# Patient Record
Sex: Male | Born: 1965 | Hispanic: Yes | Smoking: Current every day smoker
Health system: Southern US, Community
[De-identification: ages and names within clinical notes are randomized; demographics above are authoritative.]

## PROBLEM LIST (undated history)

## (undated) ENCOUNTER — Telehealth: Attending: Hematology | Primary: Hematology

## (undated) ENCOUNTER — Encounter

## (undated) ENCOUNTER — Ambulatory Visit

## (undated) ENCOUNTER — Encounter: Attending: Pharmacist | Primary: Pharmacist

## (undated) ENCOUNTER — Encounter: Attending: Hematology | Primary: Hematology

## (undated) ENCOUNTER — Encounter: Payer: MEDICAID | Attending: Pharmacist | Primary: Pharmacist

## (undated) ENCOUNTER — Telehealth: Attending: Physician Assistant | Primary: Physician Assistant

## (undated) ENCOUNTER — Ambulatory Visit: Attending: Hematology & Oncology | Primary: Hematology & Oncology

## (undated) ENCOUNTER — Telehealth

## (undated) ENCOUNTER — Ambulatory Visit: Attending: Physician Assistant | Primary: Physician Assistant

## (undated) ENCOUNTER — Telehealth: Attending: Pharmacist | Primary: Pharmacist

## (undated) ENCOUNTER — Encounter: Payer: MEDICAID | Attending: Hematology | Primary: Hematology

## (undated) ENCOUNTER — Ambulatory Visit (HOSPITAL_COMMUNITY): Admission: EM | Payer: Medicare Other

## (undated) DIAGNOSIS — C9210 Chronic myeloid leukemia, BCR/ABL-positive, not having achieved remission: Secondary | ICD-10-CM

## (undated) HISTORY — DX: Chronic myeloid leukemia, BCR/ABL-positive, not having achieved remission: C92.10

## (undated) HISTORY — PX: OTHER SURGICAL HISTORY: SHX169

---

## 2006-03-03 DIAGNOSIS — C9210 Chronic myeloid leukemia, BCR/ABL-positive, not having achieved remission: Secondary | ICD-10-CM

## 2006-03-03 HISTORY — DX: Chronic myeloid leukemia, BCR/ABL-positive, not having achieved remission: C92.10

## 2006-07-10 ENCOUNTER — Encounter (INDEPENDENT_AMBULATORY_CARE_PROVIDER_SITE_OTHER): Payer: Self-pay | Admitting: Oncology

## 2006-07-10 ENCOUNTER — Other Ambulatory Visit: Admission: RE | Admit: 2006-07-10 | Discharge: 2006-07-10 | Payer: Self-pay | Admitting: Oncology

## 2006-07-24 ENCOUNTER — Ambulatory Visit: Payer: Self-pay | Admitting: Oncology

## 2006-11-19 ENCOUNTER — Ambulatory Visit: Payer: Self-pay | Admitting: Oncology

## 2008-08-28 ENCOUNTER — Encounter (INDEPENDENT_AMBULATORY_CARE_PROVIDER_SITE_OTHER): Payer: Self-pay | Admitting: Oncology

## 2008-08-28 ENCOUNTER — Other Ambulatory Visit: Admission: RE | Admit: 2008-08-28 | Discharge: 2008-08-28 | Payer: Self-pay | Admitting: Oncology

## 2011-11-21 ENCOUNTER — Encounter: Payer: Self-pay | Admitting: Physical Medicine & Rehabilitation

## 2011-11-28 ENCOUNTER — Ambulatory Visit: Payer: Medicaid Other | Admitting: Physical Medicine & Rehabilitation

## 2011-11-28 ENCOUNTER — Encounter: Payer: Medicaid Other | Attending: Physical Medicine & Rehabilitation

## 2011-12-15 ENCOUNTER — Ambulatory Visit: Payer: Medicaid Other | Admitting: Physical Medicine & Rehabilitation

## 2011-12-18 ENCOUNTER — Ambulatory Visit: Payer: Medicaid Other | Admitting: Physical Medicine & Rehabilitation

## 2011-12-30 ENCOUNTER — Ambulatory Visit (HOSPITAL_BASED_OUTPATIENT_CLINIC_OR_DEPARTMENT_OTHER): Payer: Medicaid Other | Admitting: Physical Medicine & Rehabilitation

## 2011-12-30 ENCOUNTER — Encounter: Payer: Self-pay | Admitting: Physical Medicine & Rehabilitation

## 2011-12-30 ENCOUNTER — Encounter: Payer: Medicaid Other | Attending: Physical Medicine & Rehabilitation

## 2011-12-30 VITALS — BP 116/68 | HR 73 | Ht 68.0 in | Wt 137.0 lb

## 2011-12-30 DIAGNOSIS — M542 Cervicalgia: Secondary | ICD-10-CM | POA: Insufficient documentation

## 2011-12-30 DIAGNOSIS — Z5181 Encounter for therapeutic drug level monitoring: Secondary | ICD-10-CM

## 2011-12-30 DIAGNOSIS — M25519 Pain in unspecified shoulder: Secondary | ICD-10-CM | POA: Insufficient documentation

## 2011-12-30 DIAGNOSIS — R209 Unspecified disturbances of skin sensation: Secondary | ICD-10-CM | POA: Insufficient documentation

## 2011-12-30 DIAGNOSIS — C9210 Chronic myeloid leukemia, BCR/ABL-positive, not having achieved remission: Secondary | ICD-10-CM

## 2011-12-30 DIAGNOSIS — IMO0001 Reserved for inherently not codable concepts without codable children: Secondary | ICD-10-CM

## 2011-12-30 DIAGNOSIS — F17200 Nicotine dependence, unspecified, uncomplicated: Secondary | ICD-10-CM | POA: Insufficient documentation

## 2011-12-30 DIAGNOSIS — M47812 Spondylosis without myelopathy or radiculopathy, cervical region: Secondary | ICD-10-CM | POA: Insufficient documentation

## 2011-12-30 DIAGNOSIS — M549 Dorsalgia, unspecified: Secondary | ICD-10-CM

## 2011-12-30 MED ORDER — TRAMADOL HCL 50 MG PO TABS
50.0000 mg | ORAL_TABLET | Freq: Three times a day (TID) | ORAL | Status: DC | PRN
Start: 2011-12-30 — End: 2012-03-26

## 2011-12-30 MED ORDER — DICLOFENAC SODIUM 1 % TD GEL: 2.0000 g | Freq: Four times a day (QID) | TRANSDERMAL | Status: DC

## 2011-12-30 NOTE — Patient Instructions (Signed)
Please have a chiropractor send me notes as well as x-ray
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
## 2012-01-27 ENCOUNTER — Ambulatory Visit (HOSPITAL_BASED_OUTPATIENT_CLINIC_OR_DEPARTMENT_OTHER): Payer: Medicaid Other | Admitting: Physical Medicine & Rehabilitation

## 2012-01-27 ENCOUNTER — Encounter: Payer: Self-pay | Admitting: Physical Medicine & Rehabilitation

## 2012-01-27 ENCOUNTER — Encounter: Payer: Medicaid Other | Attending: Physical Medicine & Rehabilitation

## 2012-01-27 VITALS — BP 111/67 | HR 68 | Resp 14 | Ht 68.0 in | Wt 142.6 lb

## 2012-01-27 DIAGNOSIS — M47812 Spondylosis without myelopathy or radiculopathy, cervical region: Secondary | ICD-10-CM | POA: Insufficient documentation

## 2012-01-27 DIAGNOSIS — R209 Unspecified disturbances of skin sensation: Secondary | ICD-10-CM | POA: Insufficient documentation

## 2012-01-27 DIAGNOSIS — IMO0001 Reserved for inherently not codable concepts without codable children: Secondary | ICD-10-CM | POA: Insufficient documentation

## 2012-01-27 DIAGNOSIS — F17200 Nicotine dependence, unspecified, uncomplicated: Secondary | ICD-10-CM | POA: Insufficient documentation

## 2012-01-27 DIAGNOSIS — M25519 Pain in unspecified shoulder: Secondary | ICD-10-CM

## 2012-01-27 DIAGNOSIS — M542 Cervicalgia: Secondary | ICD-10-CM | POA: Insufficient documentation

## 2012-01-27 NOTE — Patient Instructions (Signed)
You had a right a.c. Joint injection today. We injected lidocaine and a cortisone medicine. It may take up to 3 days to take full effect. I'll see back next month

## 2012-01-27 NOTE — Progress Notes (Signed)
Right shoulder injection a.c. Joint under ultrasound guidance Indication a.c. Joint pain not relieved by other medications and conservative care Informed consent was obtained after describing risks and benefits of the procedure with the patient his include bleeding bruising and infection he elects to proceed and has given written consent Patient placed in a seated position a.c. Joint was pre-scanned area was marked and prepped with Betadine and entered with a 25-gauge injection needle 1 cc 1% lidocaine was injected into the skin and subcutaneous tissues followed by insertion of 40 mm pedicle block needle into the right SI joint under direct ultrasound guidance. Then 1/2 cc of 40 mg Depo-Medrol and 1 cc of 1% lidocaine were injected. Patient tolerated procedure well. Post procedure instructions given

## 2012-02-20 ENCOUNTER — Telehealth: Payer: Self-pay | Admitting: *Deleted

## 2012-02-20 NOTE — Telephone Encounter (Signed)
Patients doesn't feel his medication is working.  He is going to try tylenol and/or ibuprofen intermittently, heat and ice until his next appointment.

## 2012-02-20 NOTE — Telephone Encounter (Signed)
Please call about pain medication. Not helping

## 2012-02-27 ENCOUNTER — Encounter: Payer: Medicaid Other | Attending: Physical Medicine & Rehabilitation

## 2012-02-27 ENCOUNTER — Ambulatory Visit: Payer: Medicaid Other | Admitting: Physical Medicine & Rehabilitation

## 2012-02-27 DIAGNOSIS — IMO0001 Reserved for inherently not codable concepts without codable children: Secondary | ICD-10-CM | POA: Insufficient documentation

## 2012-02-27 DIAGNOSIS — M47812 Spondylosis without myelopathy or radiculopathy, cervical region: Secondary | ICD-10-CM | POA: Insufficient documentation

## 2012-02-27 DIAGNOSIS — M25519 Pain in unspecified shoulder: Secondary | ICD-10-CM | POA: Insufficient documentation

## 2012-02-27 DIAGNOSIS — F17200 Nicotine dependence, unspecified, uncomplicated: Secondary | ICD-10-CM | POA: Insufficient documentation

## 2012-02-27 DIAGNOSIS — M542 Cervicalgia: Secondary | ICD-10-CM | POA: Insufficient documentation

## 2012-02-27 DIAGNOSIS — R209 Unspecified disturbances of skin sensation: Secondary | ICD-10-CM | POA: Insufficient documentation

## 2012-03-26 ENCOUNTER — Encounter: Payer: Medicaid Other | Attending: Physical Medicine & Rehabilitation

## 2012-03-26 ENCOUNTER — Encounter: Payer: Self-pay | Admitting: Physical Medicine & Rehabilitation

## 2012-03-26 ENCOUNTER — Ambulatory Visit (HOSPITAL_BASED_OUTPATIENT_CLINIC_OR_DEPARTMENT_OTHER): Payer: Medicaid Other | Admitting: Physical Medicine & Rehabilitation

## 2012-03-26 VITALS — BP 123/78 | HR 93 | Resp 14 | Ht 68.0 in | Wt 148.0 lb

## 2012-03-26 DIAGNOSIS — F17200 Nicotine dependence, unspecified, uncomplicated: Secondary | ICD-10-CM | POA: Insufficient documentation

## 2012-03-26 DIAGNOSIS — M47812 Spondylosis without myelopathy or radiculopathy, cervical region: Secondary | ICD-10-CM | POA: Insufficient documentation

## 2012-03-26 DIAGNOSIS — R209 Unspecified disturbances of skin sensation: Secondary | ICD-10-CM | POA: Insufficient documentation

## 2012-03-26 DIAGNOSIS — IMO0001 Reserved for inherently not codable concepts without codable children: Secondary | ICD-10-CM | POA: Insufficient documentation

## 2012-03-26 DIAGNOSIS — M542 Cervicalgia: Secondary | ICD-10-CM | POA: Insufficient documentation

## 2012-03-26 DIAGNOSIS — G5610 Other lesions of median nerve, unspecified upper limb: Secondary | ICD-10-CM

## 2012-03-26 DIAGNOSIS — M25519 Pain in unspecified shoulder: Secondary | ICD-10-CM | POA: Insufficient documentation

## 2012-03-26 MED ORDER — TRAMADOL HCL 50 MG PO TABS: 50.0000 mg | ORAL_TABLET | Freq: Three times a day (TID) | ORAL | Status: DC | PRN

## 2012-03-26 MED ORDER — METHYLPREDNISOLONE 4 MG PO KIT: PACK | ORAL | Status: DC

## 2012-03-26 NOTE — Patient Instructions (Signed)
Please get a wrist splint from a pharmacy  that you can wear at night on the right side for your carpal tunnel syndrome You'll take a strong anti-inflammatory tori called Medrol Dosepak. Do not use any Advil or ibuprofen or naproxen when you taking this medicine

## 2012-04-22 ENCOUNTER — Ambulatory Visit (HOSPITAL_BASED_OUTPATIENT_CLINIC_OR_DEPARTMENT_OTHER): Payer: Medicaid Other | Admitting: Physical Medicine & Rehabilitation

## 2012-04-22 ENCOUNTER — Encounter: Payer: Medicaid Other | Attending: Physical Medicine & Rehabilitation

## 2012-04-22 ENCOUNTER — Encounter: Payer: Self-pay | Admitting: Physical Medicine & Rehabilitation

## 2012-04-22 VITALS — BP 143/84 | HR 118 | Resp 14 | Ht 68.0 in | Wt 145.0 lb

## 2012-04-22 DIAGNOSIS — G5610 Other lesions of median nerve, unspecified upper limb: Secondary | ICD-10-CM

## 2012-04-22 DIAGNOSIS — IMO0001 Reserved for inherently not codable concepts without codable children: Secondary | ICD-10-CM | POA: Insufficient documentation

## 2012-04-22 DIAGNOSIS — M542 Cervicalgia: Secondary | ICD-10-CM | POA: Insufficient documentation

## 2012-04-22 DIAGNOSIS — M25519 Pain in unspecified shoulder: Secondary | ICD-10-CM | POA: Insufficient documentation

## 2012-04-22 DIAGNOSIS — M47812 Spondylosis without myelopathy or radiculopathy, cervical region: Secondary | ICD-10-CM | POA: Insufficient documentation

## 2012-04-22 DIAGNOSIS — F17200 Nicotine dependence, unspecified, uncomplicated: Secondary | ICD-10-CM | POA: Insufficient documentation

## 2012-04-22 DIAGNOSIS — R209 Unspecified disturbances of skin sensation: Secondary | ICD-10-CM | POA: Insufficient documentation

## 2012-04-22 NOTE — Patient Instructions (Signed)
You may have numbness in your fingers today.

## 2012-04-22 NOTE — Progress Notes (Signed)
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 Symptoms are not relieved by conservative care.  Informed consent was obtained after describing risks and benefits of the procedure with the patient. These include bleeding bruising and infection as well as nerve injury. Patient elected to proceed and has given written consent. The distal wrist crease was marked and prepped with Betadine. A 30-gauge 1/2 inch needle was inserted and 0.25 ML's of 1% lidocaine injected into the skin and subcutaneous tissue. Then a 25-gauge 11/2 inch needle was inserted into the carpal tunnel and 0.25 mL of depomedrol 40 mg per mL was injected. Patient tolerated procedure well. Post procedure instructions given.

## 2012-04-23 ENCOUNTER — Telehealth: Payer: Self-pay

## 2012-04-23 NOTE — Telephone Encounter (Signed)
Patient called to find out if he was getting any other pain medication.  He would like something stronger.  Please advise.

## 2012-04-23 NOTE — Telephone Encounter (Signed)
No he was still getting hydrocodone from PCP

## 2012-04-26 NOTE — Telephone Encounter (Signed)
Tried both numbers for patient.  Neither available.

## 2012-05-18 ENCOUNTER — Inpatient Hospital Stay: Payer: Medicaid Other | Admitting: Physical Medicine & Rehabilitation

## 2012-05-18 DIAGNOSIS — F17200 Nicotine dependence, unspecified, uncomplicated: Secondary | ICD-10-CM | POA: Insufficient documentation

## 2012-05-18 DIAGNOSIS — M542 Cervicalgia: Secondary | ICD-10-CM | POA: Insufficient documentation

## 2012-05-18 DIAGNOSIS — M47812 Spondylosis without myelopathy or radiculopathy, cervical region: Secondary | ICD-10-CM | POA: Insufficient documentation

## 2012-05-18 DIAGNOSIS — M25519 Pain in unspecified shoulder: Secondary | ICD-10-CM | POA: Insufficient documentation

## 2012-05-18 DIAGNOSIS — IMO0001 Reserved for inherently not codable concepts without codable children: Secondary | ICD-10-CM | POA: Insufficient documentation

## 2012-05-18 DIAGNOSIS — R209 Unspecified disturbances of skin sensation: Secondary | ICD-10-CM | POA: Insufficient documentation

## 2012-05-27 ENCOUNTER — Ambulatory Visit (HOSPITAL_BASED_OUTPATIENT_CLINIC_OR_DEPARTMENT_OTHER): Payer: Medicaid Other | Admitting: Physical Medicine & Rehabilitation

## 2012-05-27 ENCOUNTER — Encounter: Payer: Medicaid Other | Attending: Physical Medicine & Rehabilitation

## 2012-05-27 ENCOUNTER — Encounter: Payer: Self-pay | Admitting: Physical Medicine & Rehabilitation

## 2012-05-27 VITALS — BP 128/73 | HR 76 | Resp 14 | Ht 69.0 in | Wt 142.0 lb

## 2012-05-27 DIAGNOSIS — M47812 Spondylosis without myelopathy or radiculopathy, cervical region: Secondary | ICD-10-CM

## 2012-05-27 DIAGNOSIS — Z5181 Encounter for therapeutic drug level monitoring: Secondary | ICD-10-CM

## 2012-05-27 DIAGNOSIS — IMO0001 Reserved for inherently not codable concepts without codable children: Secondary | ICD-10-CM

## 2012-05-27 DIAGNOSIS — Z79899 Other long term (current) drug therapy: Secondary | ICD-10-CM

## 2012-05-27 DIAGNOSIS — M545 Low back pain, unspecified: Secondary | ICD-10-CM

## 2012-05-27 MED ORDER — TRAMADOL HCL 50 MG PO TABS: 100.0000 mg | ORAL_TABLET | Freq: Three times a day (TID) | ORAL | Status: DC | PRN

## 2012-05-27 MED ORDER — CYCLOBENZAPRINE HCL 5 MG PO TABS: 5.0000 mg | ORAL_TABLET | Freq: Three times a day (TID) | ORAL | Status: DC | PRN

## 2012-05-27 NOTE — Patient Instructions (Signed)
Double your tramadol dose Flexeril up to 3 times per day to reduce back spasms Physical therapy for neck and back exercises X-ray lumbar spine Urine drug screen today See me in 3 weeks  recheck

## 2012-05-27 NOTE — Progress Notes (Signed)
Subjective:    Patient ID: Randall [name], male    DOB: 01-18-1966, 47 y.o.   MRN: [id number]  HPI Complaint of low back pain which has started approximately 2-3 weeks ago. No history of,. No bowel or bladder dysfunction. No fever chills. No lower extremity weakness. Continues to have chronic neck pain. Tramadol 50 mg 3 times a day plus Voltaren gel to neck is not helping. Laying on a hard surface with his legs up seems to help. Twisting and popping his back seems to help as well. Pain Inventory Average Pain 8 Pain Right Now 8 My pain is sharp, burning, dull, stabbing, tingling and aching  In the last 24 hours, has pain interfered with the following? General activity 3 Relation with others 3 Enjoyment of life 4 What TIME of day is your pain at its worst? all the time Sleep (in general) Poor  Pain is worse with: bending, sitting and standing Pain improves with: medication Relief from Meds: 3  Mobility how many minutes can you walk? 20 ability to climb steps?  yes do you drive?  no needs help with transfers Do you have any goals in this area?  yes  Function disabled: date disabled 26 I need assistance with the following:  feeding, meal prep, household duties and shopping  Neuro/Psych bladder control problems bowel control problems weakness numbness tingling spasms confusion depression anxiety loss of taste or smell  Prior Studies Any changes since last visit?  no  Physicians involved in your care Any changes since last visit?  no   History reviewed. No pertinent family history. History   Social History  . Marital Status: Married    Spouse Name: N/A    Number of Children: N/A  . Years of Education: N/A   Social History Main Topics  . Smoking status: Current Every Day Smoker    Types: Cigarettes    Start date: 06/30/2011  . Smokeless tobacco: Never Used  . Alcohol Use: None  . Drug Use: None  . Sexually Active: None   Other Topics Concern  .  None   Social History Narrative  . None   Past Surgical History  Procedure Laterality Date  . Splenectomy  2010-2011   Past Medical History  Diagnosis Date  . CML (chronic myelocytic leukemia) 2008   BP 128/73  Pulse 76  Resp 14  Ht 5\' 9"  (1.753 m)  Wt 142 lb (64.411 kg)  BMI 20.96 kg/m2  SpO2 100%     Review of Systems  Constitutional: Positive for fever and unexpected weight change.  Respiratory: Positive for apnea.   Gastrointestinal: Positive for diarrhea and constipation.  Genitourinary: Positive for difficulty urinating.  Musculoskeletal: Positive for back pain.  Neurological: Positive for weakness and numbness.  Psychiatric/Behavioral: Positive for confusion and dysphoric mood. The patient is nervous/anxious.   All other systems reviewed and are negative.       Objective:   Physical Exam  Nursing note and vitals reviewed. Constitutional: He is oriented to person, place, and time. He appears well-developed and well-nourished.  HENT:  Head: Normocephalic and atraumatic.  Eyes: Conjunctivae and EOM are normal. Pupils are equal, round, and reactive to light.  Musculoskeletal:  Reduced in flexion extension lumbar spine twisting is normal and helps pain Able to touch patella when bending toward Negative straight leg raise  Neurological: He is alert and oriented to person, place, and time. He has normal strength. He displays no atrophy. He exhibits normal muscle tone. Coordination and gait normal.  Reflex Scores:      Patellar reflexes are 1+ on the right side and 2+ on the left side.      Achilles reflexes are 2+ on the right side and 2+ on the left side. Decreased left L678 dermatomal pinprick  Psychiatric: He has a normal mood and affect.   decrease left L4, decreased right L5 and S1 dermatome pinprick  Mild tenderness to palpation lumbar paraspinal muscles. Mild tenderness palpation upper trapezius muscle group      Assessment & Plan:  1. 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 Overall stable no evidence of progressive radiculopathy.Will order a transcutaneous electrical nerve stimulator trial. Instruction with physical therapy on cervicothoracic stabilization exercise 2. Lumbar pain: Referral to physical therapy instruction on lumbosacral stabilization program Increase tramadol to 100 mg 3 times a day Flexeril 5 mg 3 times a day for the next 7-10 days Lumbosacral spine x-rays history CML although I do not think this is a cancer-related problem  Return to clinic 3 weeks. Urine drug screen today. Consider low-dose narcotic analgesic next visit if not much better

## 2012-06-03 NOTE — Progress Notes (Signed)
Since he was previously non narcotic we can offer him that Otherwise we can dismiss and give list

## 2012-06-21 ENCOUNTER — Ambulatory Visit (HOSPITAL_BASED_OUTPATIENT_CLINIC_OR_DEPARTMENT_OTHER): Payer: Medicaid Other | Admitting: Physical Medicine & Rehabilitation

## 2012-06-21 ENCOUNTER — Encounter: Payer: Self-pay | Admitting: Physical Medicine & Rehabilitation

## 2012-06-21 ENCOUNTER — Encounter: Payer: Medicaid Other | Attending: Physical Medicine & Rehabilitation

## 2012-06-21 VITALS — BP 136/85 | HR 88 | Resp 15 | Ht 68.0 in | Wt 134.0 lb

## 2012-06-21 DIAGNOSIS — Z856 Personal history of leukemia: Secondary | ICD-10-CM | POA: Insufficient documentation

## 2012-06-21 DIAGNOSIS — M5030 Other cervical disc degeneration, unspecified cervical region: Secondary | ICD-10-CM | POA: Insufficient documentation

## 2012-06-21 DIAGNOSIS — IMO0001 Reserved for inherently not codable concepts without codable children: Secondary | ICD-10-CM | POA: Insufficient documentation

## 2012-06-21 DIAGNOSIS — M47812 Spondylosis without myelopathy or radiculopathy, cervical region: Secondary | ICD-10-CM | POA: Insufficient documentation

## 2012-06-21 DIAGNOSIS — M545 Low back pain, unspecified: Secondary | ICD-10-CM | POA: Insufficient documentation

## 2012-06-21 MED ORDER — PREGABALIN 75 MG PO CAPS: 75.0000 mg | ORAL_CAPSULE | Freq: Two times a day (BID) | ORAL | Status: DC

## 2012-06-21 MED ORDER — DICLOFENAC SODIUM 1 % TD GEL: 2.0000 g | Freq: Four times a day (QID) | TRANSDERMAL | Status: DC

## 2012-06-21 MED ORDER — CYCLOBENZAPRINE HCL 10 MG PO TABS: 10.0000 mg | ORAL_TABLET | Freq: Three times a day (TID) | ORAL | Status: DC | PRN

## 2012-06-21 MED ORDER — CYCLOBENZAPRINE HCL 5 MG PO TABS: 10.0000 mg | ORAL_TABLET | Freq: Three times a day (TID) | ORAL | Status: DC | PRN

## 2012-06-21 NOTE — Progress Notes (Signed)
Subjective:    Patient ID: Randall [name], male    DOB: 02/24/1966, 47 y.o.   MRN: [id number]  HPI Neck and back pain. No falls no injuries. UDS positive for THC. Has not followed through on physical therapy Has not followed through on x-rays of lumbar spine.  Past history CML in remission Pain Inventory Average Pain 8 Pain Right Now n/a My pain is sharp, burning, stabbing, tingling and aching  In the last 24 hours, has pain interfered with the following? General activity 6 Relation with others 6 Enjoyment of life 6 What TIME of day is your pain at its worst? morning,evening,night time Sleep (in general) Poor  Pain is worse with: sitting and some activites Pain improves with: medication Relief from Meds: 2  Mobility how many minutes can you walk? 15-20 min. ability to climb steps?  yes do you drive?  no transfers alone  Function not employed: date last employed n/a I need assistance with the following:  household duties Do you have any goals in this area?  no  Neuro/Psych weakness numbness tingling spasms dizziness depression anxiety  Prior Studies Any changes since last visit?  no x-rays CT/MRI nerve study  Physicians involved in your care Any changes since last visit?  no   History reviewed. No pertinent family history. History   Social History  . Marital Status: Married    Spouse Name: N/A    Number of Children: N/A  . Years of Education: N/A   Social History Main Topics  . Smoking status: Current Every Day Smoker    Types: Cigarettes    Start date: 06/30/2011  . Smokeless tobacco: Never Used  . Alcohol Use: None  . Drug Use: None  . Sexually Active: None   Other Topics Concern  . None   Social History Narrative  . None   Past Surgical History  Procedure Laterality Date  . Splenectomy  2010-2011   Past Medical History  Diagnosis Date  . CML (chronic myelocytic leukemia) 2008   BP 136/85  Pulse 88  Resp 15  Ht 5\' 8"  (1.727  m)  Wt 134 lb (60.782 kg)  BMI 20.38 kg/m2  SpO2 95%      Review of Systems  Constitutional: Positive for diaphoresis, appetite change and unexpected weight change.  Respiratory: Positive for shortness of breath.   Gastrointestinal: Positive for abdominal pain and constipation.  Neurological: Positive for dizziness, weakness and numbness.  Psychiatric/Behavioral: Positive for dysphoric mood and agitation.  All other systems reviewed and are negative.       Objective:   Physical Exam  Nursing note and vitals reviewed. Constitutional: He is oriented to person, place, and time. He appears well-developed and well-nourished.  HENT:  Head: Normocephalic and atraumatic.  Eyes: Conjunctivae and EOM are normal. Pupils are equal, round, and reactive to light.  Neck: Normal range of motion. Neck supple.  Musculoskeletal: Normal range of motion.       Cervical back: He exhibits tenderness. He exhibits normal range of motion and no deformity.       Thoracic back: He exhibits tenderness. He exhibits normal range of motion and no deformity.       Lumbar back: He exhibits tenderness. He exhibits normal range of motion and no pain.  9/18 fibromyalgia pts. positive  Neurological: He is alert and oriented to person, place, and time. He has normal reflexes. He displays normal reflexes. He exhibits normal muscle tone. Coordination normal.  Psychiatric: He has a normal mood and  affect.          Assessment & Plan:  1. Cervical degenerative disc as well as cervical spondylosis. Overall stable no evidence of progressive radiculopathy.Will order a transcutaneous electrical nerve stimulator trial. Instruction with physical therapy on cervicothoracic stabilization exercise 2. Lumbar pain: Referral to physical therapy instruction on lumbosacral stabilization program 3.  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

## 2012-08-02 ENCOUNTER — Encounter: Payer: Medicaid Other | Attending: Physical Medicine & Rehabilitation

## 2012-08-02 ENCOUNTER — Ambulatory Visit: Payer: Medicaid Other | Admitting: Physical Medicine & Rehabilitation

## 2012-08-02 DIAGNOSIS — M47812 Spondylosis without myelopathy or radiculopathy, cervical region: Secondary | ICD-10-CM | POA: Insufficient documentation

## 2012-08-02 DIAGNOSIS — R209 Unspecified disturbances of skin sensation: Secondary | ICD-10-CM | POA: Insufficient documentation

## 2012-08-02 DIAGNOSIS — F17200 Nicotine dependence, unspecified, uncomplicated: Secondary | ICD-10-CM | POA: Insufficient documentation

## 2012-08-02 DIAGNOSIS — IMO0001 Reserved for inherently not codable concepts without codable children: Secondary | ICD-10-CM | POA: Insufficient documentation

## 2012-08-02 DIAGNOSIS — M542 Cervicalgia: Secondary | ICD-10-CM | POA: Insufficient documentation

## 2012-08-02 DIAGNOSIS — M25519 Pain in unspecified shoulder: Secondary | ICD-10-CM | POA: Insufficient documentation

## 2012-08-12 ENCOUNTER — Ambulatory Visit: Payer: Medicaid Other | Admitting: Physical Medicine & Rehabilitation

## 2016-01-08 DIAGNOSIS — C9210 Chronic myeloid leukemia, BCR/ABL-positive, not having achieved remission: Secondary | ICD-10-CM

## 2016-01-08 DIAGNOSIS — B1920 Unspecified viral hepatitis C without hepatic coma: Secondary | ICD-10-CM

## 2016-10-24 DIAGNOSIS — B1920 Unspecified viral hepatitis C without hepatic coma: Secondary | ICD-10-CM

## 2016-10-24 DIAGNOSIS — R5383 Other fatigue: Secondary | ICD-10-CM

## 2016-10-24 DIAGNOSIS — C9210 Chronic myeloid leukemia, BCR/ABL-positive, not having achieved remission: Secondary | ICD-10-CM

## 2016-12-26 DIAGNOSIS — C9210 Chronic myeloid leukemia, BCR/ABL-positive, not having achieved remission: Secondary | ICD-10-CM

## 2016-12-26 DIAGNOSIS — F329 Major depressive disorder, single episode, unspecified: Secondary | ICD-10-CM

## 2017-02-26 DIAGNOSIS — C9210 Chronic myeloid leukemia, BCR/ABL-positive, not having achieved remission: Secondary | ICD-10-CM | POA: Diagnosis not present

## 2017-02-26 DIAGNOSIS — R110 Nausea: Secondary | ICD-10-CM | POA: Diagnosis not present

## 2017-04-09 DIAGNOSIS — C9210 Chronic myeloid leukemia, BCR/ABL-positive, not having achieved remission: Secondary | ICD-10-CM | POA: Diagnosis not present

## 2017-07-23 DIAGNOSIS — C9210 Chronic myeloid leukemia, BCR/ABL-positive, not having achieved remission: Secondary | ICD-10-CM

## 2017-11-26 DIAGNOSIS — Z79899 Other long term (current) drug therapy: Secondary | ICD-10-CM

## 2017-11-26 DIAGNOSIS — C9210 Chronic myeloid leukemia, BCR/ABL-positive, not having achieved remission: Secondary | ICD-10-CM

## 2018-07-26 ENCOUNTER — Other Ambulatory Visit: Payer: Self-pay

## 2018-07-26 ENCOUNTER — Encounter (HOSPITAL_COMMUNITY): Payer: Self-pay

## 2018-07-26 ENCOUNTER — Emergency Department (HOSPITAL_COMMUNITY)
Admission: EM | Admit: 2018-07-26 | Discharge: 2018-07-26 | Disposition: A | Discharge status: Home / Self Care | Payer: Medicaid Other | Attending: Emergency Medicine | Admitting: Emergency Medicine

## 2018-07-26 ENCOUNTER — Emergency Department (HOSPITAL_COMMUNITY)
Admission: EM | Admit: 2018-07-26 | Discharge: 2018-07-27 | Disposition: A | Discharge status: Home / Self Care | Payer: Medicaid Other | Source: Home / Self Care | Attending: Emergency Medicine | Admitting: Emergency Medicine

## 2018-07-26 DIAGNOSIS — R45850 Homicidal ideations: Secondary | ICD-10-CM | POA: Insufficient documentation

## 2018-07-26 DIAGNOSIS — F329 Major depressive disorder, single episode, unspecified: Secondary | ICD-10-CM | POA: Insufficient documentation

## 2018-07-26 DIAGNOSIS — F15950 Other stimulant use, unspecified with stimulant-induced psychotic disorder with delusions: Secondary | ICD-10-CM

## 2018-07-26 DIAGNOSIS — Z856 Personal history of leukemia: Secondary | ICD-10-CM | POA: Insufficient documentation

## 2018-07-26 DIAGNOSIS — Z590 Homelessness: Secondary | ICD-10-CM | POA: Insufficient documentation

## 2018-07-26 DIAGNOSIS — Z79899 Other long term (current) drug therapy: Secondary | ICD-10-CM | POA: Insufficient documentation

## 2018-07-26 DIAGNOSIS — R45851 Suicidal ideations: Secondary | ICD-10-CM | POA: Diagnosis not present

## 2018-07-26 DIAGNOSIS — F1910 Other psychoactive substance abuse, uncomplicated: Secondary | ICD-10-CM

## 2018-07-26 DIAGNOSIS — F29 Unspecified psychosis not due to a substance or known physiological condition: Secondary | ICD-10-CM

## 2018-07-26 DIAGNOSIS — F1510 Other stimulant abuse, uncomplicated: Secondary | ICD-10-CM | POA: Diagnosis present

## 2018-07-26 DIAGNOSIS — F17210 Nicotine dependence, cigarettes, uncomplicated: Secondary | ICD-10-CM | POA: Insufficient documentation

## 2018-07-26 DIAGNOSIS — F15959 Other stimulant use, unspecified with stimulant-induced psychotic disorder, unspecified: Secondary | ICD-10-CM | POA: Diagnosis present

## 2018-07-26 LAB — SALICYLATE LEVEL: Salicylate Lvl: <7 mg/dL (ref 2.8–30.0)

## 2018-07-26 LAB — COMPREHENSIVE METABOLIC PANEL
ALT: 46 U/L — ABNORMAL HIGH (ref 0–44)
ALT: 38 U/L (ref 0–44)
AST: 54 U/L — ABNORMAL HIGH (ref 15–41)
AST: 40 U/L (ref 15–41)
Albumin: 4.3 g/dL (ref 3.5–5.0)
Albumin: 3.8 g/dL (ref 3.5–5.0)
Alkaline Phosphatase: 97 U/L (ref 38–126)
Alkaline Phosphatase: 66 U/L (ref 38–126)
Anion gap: 12 (ref 5–15)
Anion gap: 8 (ref 5–15)
BUN: 35 mg/dL — ABNORMAL HIGH (ref 6–20)
BUN: 22 mg/dL — ABNORMAL HIGH (ref 6–20)
CO2: 26 mmol/L (ref 22–32)
CO2: 28 mmol/L (ref 22–32)
Calcium: 9.1 mg/dL (ref 8.9–10.3)
Calcium: 9 mg/dL (ref 8.9–10.3)
Chloride: 98 mmol/L (ref 98–111)
Chloride: 101 mmol/L (ref 98–111)
Creatinine, Ser: 1.55 mg/dL — ABNORMAL HIGH (ref 0.61–1.24)
Creatinine, Ser: 0.95 mg/dL (ref 0.61–1.24)
GFR calc Af Amer: 59 mL/min — ABNORMAL LOW (ref >60)
GFR calc Af Amer: >60 mL/min (ref >60)
GFR calc non Af Amer: 51 mL/min — ABNORMAL LOW (ref >60)
GFR calc non Af Amer: >60 mL/min (ref >60)
Glucose, Bld: 125 mg/dL — ABNORMAL HIGH (ref 70–99)
Glucose, Bld: 128 mg/dL — ABNORMAL HIGH (ref 70–99)
Potassium: 3.5 mmol/L (ref 3.5–5.1)
Potassium: 3.8 mmol/L (ref 3.5–5.1)
Sodium: 136 mmol/L (ref 135–145)
Sodium: 137 mmol/L (ref 135–145)
Total Bilirubin: 0.7 mg/dL (ref 0.3–1.2)
Total Bilirubin: 0.9 mg/dL (ref 0.3–1.2)
Total Protein: 7.7 g/dL (ref 6.5–8.1)
Total Protein: 6.9 g/dL (ref 6.5–8.1)

## 2018-07-26 LAB — CBC WITH DIFFERENTIAL/PLATELET
Abs Immature Granulocytes: 0.04 10*3/uL (ref 0.00–0.07)
Basophils Absolute: 0.1 10*3/uL (ref 0.0–0.1)
Basophils Relative: 1 %
Eosinophils Absolute: 0.3 10*3/uL (ref 0.0–0.5)
Eosinophils Relative: 2 %
HCT: 41.6 % (ref 39.0–52.0)
Hemoglobin: 14.5 g/dL (ref 13.0–17.0)
Immature Granulocytes: 0 %
Lymphocytes Relative: 26 %
Lymphs Abs: 4.7 10*3/uL — ABNORMAL HIGH (ref 0.7–4.0)
MCH: 32.1 pg (ref 26.0–34.0)
MCHC: 34.9 g/dL (ref 30.0–36.0)
MCV: 92 fL (ref 80.0–100.0)
Monocytes Absolute: 2 10*3/uL — ABNORMAL HIGH (ref 0.1–1.0)
Monocytes Relative: 11 %
Neutro Abs: 10.7 10*3/uL — ABNORMAL HIGH (ref 1.7–7.7)
Neutrophils Relative %: 60 %
Platelets: 397 10*3/uL (ref 150–400)
RBC: 4.52 MIL/uL (ref 4.22–5.81)
RDW: 15 % (ref 11.5–15.5)
WBC: 17.8 10*3/uL — ABNORMAL HIGH (ref 4.0–10.5)
nRBC: 0 % (ref 0.0–0.2)

## 2018-07-26 LAB — RAPID URINE DRUG SCREEN, HOSP PERFORMED
Amphetamines: POSITIVE — AB
Barbiturates: NOT DETECTED
Benzodiazepines: NOT DETECTED
Cocaine: NOT DETECTED
Opiates: NOT DETECTED
Tetrahydrocannabinol: POSITIVE — AB

## 2018-07-26 LAB — CBC
HCT: 43.5 % (ref 39.0–52.0)
Hemoglobin: 14.8 g/dL (ref 13.0–17.0)
MCH: 31 pg (ref 26.0–34.0)
MCHC: 34 g/dL (ref 30.0–36.0)
MCV: 91.2 fL (ref 80.0–100.0)
Platelets: 401 10*3/uL — ABNORMAL HIGH (ref 150–400)
RBC: 4.77 MIL/uL (ref 4.22–5.81)
RDW: 14.7 % (ref 11.5–15.5)
WBC: 13.1 10*3/uL — ABNORMAL HIGH (ref 4.0–10.5)
nRBC: 0 % (ref 0.0–0.2)

## 2018-07-26 LAB — ETHANOL
Alcohol, Ethyl (B): <10 mg/dL (ref <10)
Alcohol, Ethyl (B): <10 mg/dL (ref <10)

## 2018-07-26 LAB — ACETAMINOPHEN LEVEL: Acetaminophen (Tylenol), Serum: <10 ug/mL — ABNORMAL LOW (ref 10–30)

## 2018-07-26 MED ORDER — ONDANSETRON HCL 4 MG PO TABS: 4.0000 mg | ORAL_TABLET | Freq: Three times a day (TID) | ORAL | Status: DC | PRN

## 2018-07-26 MED ORDER — NICOTINE 21 MG/24HR TD PT24: 21.0000 mg | MEDICATED_PATCH | Freq: Every day | TRANSDERMAL | Status: DC

## 2018-07-26 MED ORDER — OLANZAPINE 5 MG PO TABS
5.0000 mg | ORAL_TABLET | Freq: Two times a day (BID) | ORAL | Status: DC
  Administered 2018-07-26: 10:00:00 5 mg via ORAL
  Filled 2018-07-26: qty 1

## 2018-07-26 MED ORDER — ALUM & MAG HYDROXIDE-SIMETH 200-200-20 MG/5ML PO SUSP: 30.0000 mL | Freq: Four times a day (QID) | ORAL | Status: DC | PRN

## 2018-07-26 MED ORDER — LORAZEPAM 2 MG/ML IJ SOLN: 0.0000 mg | Freq: Two times a day (BID) | INTRAMUSCULAR | Status: DC

## 2018-07-26 MED ORDER — LORAZEPAM 1 MG PO TABS: 0.0000 mg | ORAL_TABLET | Freq: Two times a day (BID) | ORAL | Status: DC

## 2018-07-26 MED ORDER — LORAZEPAM 1 MG PO TABS: 0.0000 mg | ORAL_TABLET | Freq: Four times a day (QID) | ORAL | Status: DC

## 2018-07-26 MED ORDER — LORAZEPAM 2 MG/ML IJ SOLN: 0.0000 mg | Freq: Four times a day (QID) | INTRAMUSCULAR | Status: DC

## 2018-07-26 MED ORDER — VITAMIN B-1 100 MG PO TABS
100.0000 mg | ORAL_TABLET | Freq: Every day | ORAL | Status: DC
  Administered 2018-07-26: 100 mg via ORAL
  Filled 2018-07-26: qty 1

## 2018-07-26 MED ORDER — OLANZAPINE 5 MG PO TABS: 2.5000 mg | ORAL_TABLET | Freq: Two times a day (BID) | ORAL | 0 refills | Status: DC

## 2018-07-26 MED ORDER — THIAMINE HCL 100 MG/ML IJ SOLN: 100.0000 mg | Freq: Every day | INTRAMUSCULAR | Status: DC

## 2018-07-26 MED ORDER — OLANZAPINE 5 MG PO TABS: 2.5000 mg | ORAL_TABLET | Freq: Two times a day (BID) | ORAL | 1 refills | Status: DC

## 2018-07-26 MED ORDER — ACETAMINOPHEN 325 MG PO TABS: 650.0000 mg | ORAL_TABLET | ORAL | Status: DC | PRN

## 2018-07-26 NOTE — ED Notes (Signed)
Pt refusing to be wanded. He states that we are trying to put COVID 19 in him.

## 2018-07-26 NOTE — Consult Note (Addendum)
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 male patient who denies suicidal/homicidal ideations, hallucinations, and withdrawal symptoms  HPI:  53 yo male who presented to the ED after using drugs and feeling like people were trying to kill him.  On assessment, he stated, "I am upset because you woke me up."  Denies suicidal/homicidal ideations, hallucinations, or withdrawal symptoms.  Irritable and wants to sleep.  Patient has a long history of substance abuse. Use increases with money and decreases with limited to no funds.  Requested the RN let him eat lunch and sleep a few hours prior to discharge, stable psychiatrically.  Past Psychiatric History: substance abuse  Risk to Self:  none Risk to Others:  none Prior Inpatient Therapy:  [place] Prior Outpatient Therapy:  yes  Past Medical History:  Past Medical History:  Diagnosis Date  . 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 No pertinent family history. Family Psychiatric  History: none Social History:  Social History   Substance and Sexual Activity  Alcohol Use Not on file     Social History   Substance and Sexual Activity  Drug Use Not on file    Social History   Socioeconomic History  . Marital status: Divorced    Spouse name: Not on file  . Number of children: Not on file  . Years of education: Not on file  . Highest education level: Not on file  Occupational History  . Not on file  Social  Needs  . Financial resource strain: Not on file  . Food insecurity:    Worry: Not on file    Inability: Not on file  . Transportation needs:    Medical: Not on file    Non-medical: Not on file  Tobacco Use  . Smoking status: Current Every Day Smoker    Types: Cigarettes    Start date: 06/30/2011  . Smokeless tobacco: Never Used  Substance and Sexual Activity  . Alcohol use: Not on file  . Drug use: Not on file  . Sexual activity: Not on file  Lifestyle  . Physical activity:    Days per week: Not on file    Minutes per session: Not on file  . Stress: Not on file  Relationships  . Social connections:    Talks on phone: Not on file    Gets together: Not on file    Attends religious service: Not on file    Active member of club or organization: Not on file    Attends meetings of clubs or organizations: Not on file    Relationship status: Not on file  Other Topics Concern  . Not on file  Social History Narrative  . Not on file   Additional Social History: N/A    Allergies:   Allergies  Allergen Reactions  . Morphine And Related Nausea Only    Labs:  Results for orders placed or performed during the hospital encounter of 07/26/18 (from the past 48 hour(s))  Comprehensive metabolic panel     Status: Abnormal  Collection Time: 07/26/18  1:02 AM  Result Value Ref Range   Sodium 136 135 - 145 mmol/L   Potassium 3.5 3.5 - 5.1 mmol/L   Chloride 98 98 - 111 mmol/L   CO2 26 22 - 32 mmol/L   Glucose, Bld 125 (H) 70 - 99 mg/dL   BUN 35 (H) 6 - 20 mg/dL   Creatinine, Ser 1.55 (H) 0.61 - 1.24 mg/dL   Calcium 9.1 8.9 - 10.3 mg/dL   Total Protein 7.7 6.5 - 8.1 g/dL   Albumin 4.3 3.5 - 5.0 g/dL   AST 54 (H) 15 - 41 U/L   ALT 46 (H) 0 - 44 U/L   Alkaline Phosphatase 97 38 - 126 U/L   Total Bilirubin 0.7 0.3 - 1.2 mg/dL   GFR calc non Af Amer 51 (L) >60 mL/min   GFR calc Af Amer 59 (L) >60 mL/min   Anion gap 12 5 - 15    Comment: Performed at [facility],  [place] [address] [place], [place] [address]  Ethanol     Status: None   Collection Time: 07/26/18  1:02 AM  Result Value Ref Range   Alcohol, Ethyl (B) <10 <10 mg/dL    Comment: (NOTE) Lowest detectable limit for serum alcohol is 10 mg/dL. For medical purposes only. Performed at [facility], [place] [address], [place], [place] [address]   cbc     Status: Abnormal   Collection Time: 07/26/18  1:02 AM  Result Value Ref Range   WBC 13.1 (H) 4.0 - 10.5 K/uL   RBC 4.77 4.22 - 5.81 MIL/uL   Hemoglobin 14.8 13.0 - 17.0 g/dL   HCT 43.5 39.0 - 52.0 %   MCV 91.2 80.0 - 100.0 fL   MCH 31.0 26.0 - 34.0 pg   MCHC 34.0 30.0 - 36.0 g/dL   RDW 14.7 11.5 - 15.5 %   Platelets 401 (H) 150 - 400 K/uL   nRBC 0.0 0.0 - 0.2 %    Comment: Performed at [facility], [place] [address], [place], [place] [address]    Current Facility-Administered Medications  Medication Dose Route Frequency Provider Last Rate Last Dose  . acetaminophen (TYLENOL) tablet 650 mg  650 mg Oral Q4H PRN Mesner, Corene Cornea, MD      . alum & mag hydroxide-simeth (MAALOX/MYLANTA) 200-200-20 MG/5ML suspension 30 mL  30 mL Oral Q6H PRN Mesner, Corene Cornea, MD      . nicotine (NICODERM CQ - dosed in mg/24 hours) patch 21 mg  21 mg Transdermal Daily Mesner, Jason, MD      . OLANZapine (ZYPREXA) tablet 5 mg  5 mg Oral BID Patrecia Pour, NP   5 mg at 07/26/18 0102  . ondansetron (ZOFRAN) tablet 4 mg  4 mg Oral Q8H PRN Mesner, Corene Cornea, MD      . thiamine (VITAMIN B-1) tablet 100 mg  100 mg Oral Daily Mesner, Jason, MD   100 mg at 07/26/18 7253   Or  . thiamine (B-1) injection 100 mg  100 mg Intravenous Daily Mesner, Corene Cornea, MD       Current Outpatient Medications  Medication Sig Dispense Refill  . cyclobenzaprine (FLEXERIL) 10 MG tablet Take 1 tablet (10 mg total) by mouth 3 (three) times daily as needed for muscle spasms. 90 tablet 1  . dasatinib (SPRYCEL) 100 MG tablet Take 100 mg by mouth daily.    . diclofenac  sodium (VOLTAREN) 1 % GEL Apply 2 g topically  4 (four) times daily. 3 Tube 1  . esomeprazole (NEXIUM) 40 MG capsule Take 40 mg by mouth daily before breakfast.    . gabapentin (NEURONTIN) 600 MG tablet Take 600 mg by mouth 4 (four) times daily.    Marland Kitchen OLANZapine (ZYPREXA) 5 MG tablet Take 0.5 tablets (2.5 mg total) by mouth 2 (two) times daily. 60 tablet 0  . pravastatin (PRAVACHOL) 20 MG tablet Take 20 mg by mouth daily.    . pregabalin (LYRICA) 75 MG capsule Take 1 capsule (75 mg total) by mouth 2 (two) times daily. 60 capsule 1   Musculoskeletal: Strength & Muscle Tone: within normal limits Gait & Station: normal Patient leans: N/A  Psychiatric Specialty Exam:   Blood pressure 109/70, pulse 83, temperature 97.6 F (36.4 C), temperature source Oral, resp. rate 18, height 5\' 7"  (1.702 m), weight 70.3 kg, SpO2 96 %.Body mass index is 24.28 kg/m.  General Appearance: Casual  Eye Contact::  Good  Speech:  Normal Rate  Volume:  Normal  Mood:  Irritable  Affect:  Congruent  Thought Process:  Coherent and Descriptions of Associations: Intact  Orientation:  Full (Time, Place, and Person)  Thought Content:  WDL and Logical  Suicidal Thoughts:  No  Homicidal Thoughts:  No  Memory:  Immediate;   Fair Recent;   Fair Remote;   Fair  Judgement:  Fair  Insight:  Fair  Psychomotor Activity:  Decreased  Concentration:  Fair  Recall:  [employer] of Knowledge:Fair  Language: Good  Akathisia:  No  Handed:  Right  AIMS (if indicated):     Assets:  Leisure Time Physical Health Resilience  Sleep:   N/A  Cognition: WNL  ADL's:  Intact    Treatment Plan Summary: Amphetamine delusional disorder: -Started Zyprexa 5 mg BID -Rx provided at discharged -Outpatient resources provided  Substance abuse: -Rehab and detox information provided  Disposition: No evidence of imminent risk to self or others at present.    Waylan Boga, NP 07/26/2018 11:58 AM   Patient seen face-to-face for  psychiatric evaluation, chart reviewed and case discussed with the physician extender and developed treatment plan. Reviewed the information documented and agree with the treatment plan.  Buford Dresser, DO 07/26/18 12:39 PM

## 2018-07-26 NOTE — ED Notes (Signed)
Pt was very irritable and rude and did not want to talk to providers.

## 2018-07-26 NOTE — ED Notes (Signed)
Pt threatening and not complying with COVID swab. He is cursing and vehemently refusing. MD made aware and we will hold the swab until TTS has assessed pt.

## 2018-07-26 NOTE — ED Provider Notes (Signed)
[place] DEPT Provider Note   CSN: [id number] Arrival date & time: 07/26/18  0048    History   Chief Complaint Chief Complaint  Patient presents with  . Medical Clearance    HPI Randall [name] is a 53 y.o. male.     53 year old male with a history of mental health disturbances the presents emergency department today because he thought that he was almost killed.  He also states that they cut him up and will not say who they.  He states he walked from [place] which was 67 miles only took him 2 hours but then he wants to know the truth about "now that was from then from over there this time".  Patient thinks that we are trying to kill him but is calm about it.  Patient randomly closed over things has tangential speech is not able to give a full history.  Level  V [place] secondary to psychiatric condition.      Past Medical History:  Diagnosis Date  . 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 CML (chronic myelocytic leukemia) ([place]) 12/30/2011  . Cervical spondylosis without myelopathy 12/30/2011  . Myalgia and myositis, unspecified 12/30/2011    Past Surgical History:  Procedure Laterality Date  . 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 Authorizing Provider  ALPRAZolam Duanne Moron) 0.25 MG tablet Take 0.25 mg by mouth at bedtime as needed for sleep.    [provider]  cyclobenzaprine (FLEXERIL) 10 MG tablet Take 1 tablet (10 mg total) by mouth 3 (three) times daily as needed for muscle spasms. 06/21/12   Kirsteins, Luanna Salk, MD  dasatinib (SPRYCEL) 100 MG tablet Take 100 mg by mouth daily.    Lewis, Dequincy A, MD  diclofenac sodium (VOLTAREN) 1 % GEL Apply 2 g topically 4 (four) times daily. 06/21/12   Kirsteins, Luanna Salk, MD  esomeprazole (NEXIUM) 40 MG capsule Take 40 mg by  mouth daily before breakfast.    Suzy Bouchard, PA-C  gabapentin (NEURONTIN) 600 MG tablet Take 600 mg by mouth 4 (four) times daily.    Suzy Bouchard, PA-C  methylPREDNISolone (MEDROL, PAK,) 4 MG tablet follow package directions 03/26/12   Charlett Blake, MD  pravastatin (PRAVACHOL) 20 MG tablet Take 20 mg by mouth daily.    Suzy Bouchard, PA-C  pregabalin (LYRICA) 75 MG capsule Take 1 capsule (75 mg total) by mouth 2 (two) times daily. 06/21/12   Kirsteins, Luanna Salk, MD  traMADol (ULTRAM) 50 MG tablet Take 2 tablets (100 mg total) by mouth every 8 (eight) hours as needed for pain. 05/27/12   Kirsteins, Luanna Salk, MD    Family History History reviewed. No pertinent family history.  Social History Social History   Tobacco Use  . Smoking status: Current Every Day Smoker    Types: Cigarettes    Start date: 06/30/2011  . Smokeless tobacco: Never Used  Substance Use Topics  . Alcohol use: Not on file  . Drug use: Not on file     Allergies   Morphine and related   Review of Systems Review of Systems  Unable to perform ROS: Psychiatric disorder     Physical Exam Updated Vital Signs BP 123/82 (BP Location: Left Arm)   Pulse 90   Temp 98.5 F (36.9 C) (Oral)  Resp 18   Ht 5\' 7"  (1.702 m)   Wt 70.3 kg   SpO2 99%   BMI 24.28 kg/m   Physical Exam Vitals signs and nursing note reviewed.  Constitutional:      Appearance: He is well-developed.  HENT:     Head: Normocephalic and atraumatic.     Mouth/Throat:     Mouth: Mucous membranes are dry.     Pharynx: Oropharynx is clear.  Eyes:     Extraocular Movements: Extraocular movements intact.     Conjunctiva/sclera: Conjunctivae normal.  Neck:     Musculoskeletal: Normal range of motion.  Cardiovascular:     Rate and Rhythm: Normal rate.  Pulmonary:     Effort: Pulmonary effort is normal. No respiratory distress.  Abdominal:     General: There is no distension.  Musculoskeletal: Normal range of motion.   Skin:    General: Skin is warm and dry.  Neurological:     General: No focal deficit present.     Mental Status: He is alert.  Psychiatric:        Attention and Perception: He does not perceive auditory or visual hallucinations.        Speech: Speech is tangential.        Behavior: Behavior is not combative.        Thought Content: Thought content is paranoid and delusional. Thought content does not include homicidal or suicidal plan.      ED Treatments / Results  Labs (all labs ordered are listed, but only abnormal results are displayed) Labs Reviewed  COMPREHENSIVE METABOLIC PANEL - Abnormal; Notable for the following components:      Result Value   Glucose, Bld 125 (*)    BUN 35 (*)    Creatinine, Ser 1.55 (*)    AST 54 (*)    ALT 46 (*)    GFR calc non Af Amer 51 (*)    GFR calc Af Amer 59 (*)    All other components within normal limits  CBC - Abnormal; Notable for the following components:   WBC 13.1 (*)    Platelets 401 (*)    All other components within normal limits  SARS CORONAVIRUS 2 (HOSPITAL ORDER, [facility])  ETHANOL  RAPID URINE DRUG SCREEN, HOSP PERFORMED    EKG None  Radiology No results found.  Procedures Procedures (including critical care time)  Medications Ordered in ED Medications  ondansetron (ZOFRAN) tablet 4 mg (has no administration in time range)  alum & mag hydroxide-simeth (MAALOX/MYLANTA) 200-200-20 MG/5ML suspension 30 mL (has no administration in time range)  acetaminophen (TYLENOL) tablet 650 mg (has no administration in time range)  LORazepam (ATIVAN) injection 0-4 mg (has no administration in time range)    Or  LORazepam (ATIVAN) tablet 0-4 mg (has no administration in time range)  LORazepam (ATIVAN) injection 0-4 mg (has no administration in time range)    Or  LORazepam (ATIVAN) tablet 0-4 mg (has no administration in time range)  thiamine (VITAMIN B-1) tablet 100 mg (has no administration in  time range)    Or  thiamine (B-1) injection 100 mg (has no administration in time range)  nicotine (NICODERM CQ - dosed in mg/24 hours) patch 21 mg (has no administration in time range)     Initial Impression / Assessment and Plan / ED Course  I have reviewed the triage vital signs and the nursing notes.  Pertinent labs & imaging results that were available during  my care of the patient were reviewed by me and considered in my medical decision making (see chart for details).   Appears to be acutely psychotic without any medical reason for this.  Is been medically clear.  Consult TTS.  Patient involuntarily committed as he is starting to not want to comply with medical testing and treatment.  Final Clinical Impressions(s) / ED Diagnoses   Final diagnoses:  Depression, unspecified depression type  Psychosis, unspecified psychosis type [facility])    ED Discharge Orders    None       Elene Downum, Corene Cornea, MD 07/26/18 [phone]

## 2018-07-26 NOTE — ED Notes (Signed)
Pt is talking to his IV stating that it is a brown snake. He also is talking about a mark and that he is waiting for this RN to drop dead.

## 2018-07-26 NOTE — BH Assessment (Signed)
[facility] Assessment Progress Note  Per Buford Dresser, DO, this pt does not require psychiatric hospitalization at this time.  Pt presents under IVC initiated by EDP Merrily Pew, which Dr Mariea Clonts has rescinded.  Pt is to be discharged from [facility] with referral information for [facility], as well as information regarding area supportive services for the homeless.  This has been included in pt's discharge instructions.  Pt's nurse, Nena Jordan, has been notified.  Jalene Mullet, [place] Triage Specialist [phone]

## 2018-07-26 NOTE — ED Triage Notes (Signed)
Pt BIB [facility] complaining of R foot pain. States he was bit by a snake. No bite noted. Per EMS pt states, "I want to kill myself because if I say that, the hospital will keep me for 72 hours."

## 2018-07-26 NOTE — BHH Suicide Risk Assessment (Signed)
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 rate 18, height 5\' 7"  (1.702 m), weight 70.3 kg, SpO2 96 %.Body mass index is 24.28 kg/m.  General Appearance: Casual  Eye Contact::  Good  Speech:  Normal Rate409  Volume:  Normal  Mood:  Irritable  Affect:  Congruent  Thought Process:  Coherent and Descriptions of Associations: Intact  Orientation:  Full (Time, Place, and Person)  Thought Content:  WDL and Logical  Suicidal Thoughts:  No  Homicidal Thoughts:  No  Memory:  Immediate;   Fair Recent;   Fair Remote;   Fair  Judgement:  Fair  Insight:  Fair  Psychomotor Activity:  Decreased  Concentration:  Fair  Recall:  [employer] of Knowledge:Fair  Language: Good  Akathisia:  No  Handed:  Right  AIMS (if indicated):     Assets:  Leisure Time Physical Health Resilience  Sleep:     Cognition: WNL  ADL's:  Intact   Mental Status Per Nursing Assessment::   On Admission:   53 yo male who presented to the ED after using drugs and feeling like people were trying to kill him.  On assessment, he stated, "I am upset because you woke me up."  Denies suicidal/homicidal ideations, hallucinations, or withdrawal symptoms.  Irritable and wants to sleep.  Requested the RN let him eat lunch and sleep a few hours prior to discharge, stable psychiatrically.  Demographic Factors:  Male  Loss Factors: NA  Historical Factors: NA  Risk Reduction Factors:   Sense of responsibility to family  and Positive social support  Continued Clinical Symptoms:  Irritable  Cognitive Features That Contribute To Risk:  None    Suicide Risk:  Minimal: No identifiable suicidal ideation.  Patients presenting with no risk factors but with morbid ruminations; may be classified as minimal risk based on the severity of the depressive symptoms    Plan Of Care/Follow-up recommendations:  Activity:  as tolerated Diet:  heart healthy dietWaylan Boga, NP 07/26/2018, 11:48 AM

## 2018-07-26 NOTE — ED Notes (Signed)
Patient refused to change into blue scrubs and refused to leave.  Security escorted patient out.

## 2018-07-26 NOTE — Progress Notes (Signed)
Received Randall [name] from the main ED,constantly talking. He was oriented to his new environment went immediately to bed. He refused to talk with TTS via monitor. He continued to sleep throughout the night.

## 2018-07-26 NOTE — ED Notes (Signed)
He states that he is in the underworld and we are all already dead.

## 2018-07-26 NOTE — ED Notes (Signed)
Patient remains asleep.  Will dicharge when prescription is delivered.

## 2018-07-26 NOTE — ED Notes (Signed)
Pt A&O x 3, presents with HI, SI, feels like killing people with a bomb then killing himself with the same.  Pt calm & cooperative at present.  Feeling hopeless, depressed and sad he reports.  Monitoring for safety, sitter at bedside.

## 2018-07-26 NOTE — BH Assessment (Signed)
Attempted tele-assessment. Pt refused to participate. TTS will assess Pt when he is willing to participate.   Evelena Peat, [facility], [facility], [facility] Triage Specialist [phone]

## 2018-07-26 NOTE — ED Triage Notes (Signed)
Pt BIB [facility] [employer]. They were called out for generalized pain. Pt answers orientation questions and can follow commands. Pt is not suicidal, he states that he is already dead. He reports that he has been to the underworld and has been cut up. Pt is cooperative. Denies drug use.

## 2018-07-26 NOTE — ED Provider Notes (Signed)
53 year old male received a signout from [place] pending labs and TTS consult. Per her HPI:  "Randall [name] is a 53 y.o. male with past medical history of amphetamine abuse, CML, homelessness who presents to the emergency department for homicidal and suicidal ideation.  Patient states that he wants to kill everyone with a bomb and then blow himself up.  He was seen earlier today and put under IVC and then discharged.  Patient also complaining of right-sided foot pain and pain "everywhere."  He denies any injuries to the foot.  He denies alcohol or drug abuse however he was discharged earlier today with a diagnosis of amphetamine delusional disorder."  Physical Exam  BP 128/87 (BP Location: Right Arm)   Pulse 89   Temp 98.5 F (36.9 C) (Oral)   Resp 17   SpO2 96%   Physical Exam Vitals signs and nursing note reviewed.  Constitutional:      Appearance: He is well-developed.     Comments: Sleeping comfortable.  HENT:     Head: Normocephalic.  Eyes:     Conjunctiva/sclera: Conjunctivae normal.  Neck:     Musculoskeletal: Neck supple.  Cardiovascular:     Rate and Rhythm: Normal rate and regular rhythm.     Heart sounds: No murmur.  Pulmonary:     Effort: Pulmonary effort is normal.     Comments: Respirations are equal and unlabored.  No increased work of breathing. Abdominal:     General: There is no distension.     Palpations: Abdomen is soft.  Skin:    General: Skin is warm and dry.  Neurological:     Mental Status: He is alert.     Comments: Rouses to voice.  Psychiatric:        Behavior: Behavior normal.     ED Course/Procedures     Procedures  MDM  53 year old male with history of amphetamine abuse, CML, and homelessness received a signout from [place] pending labs and TTS consult.  Labs are notable for leukocytosis of 17.8, up from 13.124 hours ago.  Likely reactionary from amphetamine use.  No fever or chills. UDS positive for THC and amphetamines.  Labs are otherwise at baseline. Pt medically cleared at this time. Psych hold orders and home med orders not placed as patient is off all medications. TTS consulted; Deferred until AM as patient is drowsy, likely secondary to recent amphetamine intoxication.  Sleeping, but rouses easily to voice. Please see psych team notes for further documentation of care/dispo. Pt stable at time of med clearance.        Joanne Gavel, PA-C 07/27/18 3810    Jola Schmidt, MD 07/27/18 [phone]

## 2018-07-26 NOTE — Progress Notes (Signed)
53 yo male who presented to the ED after using drugs and feeling like people were trying to kill him.  Refused to participate in assessment earlier this morning. On assessment, he stated, "I am upset because you woke me up."  Denies suicidal/homicidal ideations, hallucinations, or withdrawal symptoms.  Irritable and wants to sleep.  Requested the RN let him eat lunch and sleep a few hours prior to discharge, stable psychiatrically.  Waylan Boga, PMHNP

## 2018-07-26 NOTE — ED Notes (Signed)
Patient sleeping unable to wake him up.

## 2018-07-26 NOTE — ED Notes (Signed)
Bed: [facility] Expected date:  Expected time:  Means of arrival:  Comments: 

## 2018-07-26 NOTE — ED Notes (Signed)
Pt states that a rabbit pissed in his shoe

## 2018-07-26 NOTE — ED Notes (Signed)
Patient eating his dinner.

## 2018-07-26 NOTE — ED Notes (Signed)
Margarita Mail PA at bedside to eval pt, open blister noted to sole of right foot.  Pending labs.

## 2018-07-26 NOTE — ED Notes (Signed)
Pt states, "if I dont get a blanket, I am going to kill somebody."

## 2018-07-26 NOTE — ED Notes (Addendum)
Pt ambulated to the restroom with a steady gait.

## 2018-07-26 NOTE — ED Provider Notes (Signed)
[facility] DEPT Provider Note   CSN: [id number] Arrival date & time: 07/26/18  2109    History   Chief Complaint Chief Complaint  Patient presents with  . Foot Pain  . Suicidal    HPI Randall [name] is a 53 y.o. male with past medical history of amphetamine abuse, CML, homelessness who presents to the emergency department for homicidal and suicidal ideation.  Patient states that he wants to kill everyone with a bomb and then blow himself up.  He was seen earlier today and put under IVC and then discharged.  Patient also complaining of right-sided foot pain and pain "everywhere."  He denies any injuries to the foot.  He denies alcohol or drug abuse however he was discharged earlier today with a diagnosis of amphetamine delusional disorder.    HPI  Past Medical History:  Diagnosis Date  . 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 CML (chronic myelocytic leukemia) ([place]) 12/30/2011  . Cervical spondylosis without myelopathy 12/30/2011  . Myalgia and myositis, unspecified 12/30/2011    Past Surgical History:  Procedure Laterality Date  . 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 Authorizing Provider  cyclobenzaprine (FLEXERIL) 10 MG tablet Take 1 tablet (10 mg total) by mouth 3 (three) times daily as needed for muscle spasms. 06/21/12   Kirsteins, Luanna Salk, MD  dasatinib (SPRYCEL) 100 MG tablet Take 100 mg by mouth daily.    Lewis, Dequincy A, MD  diclofenac sodium (VOLTAREN) 1 % GEL Apply 2 g topically 4 (four) times daily. 06/21/12   Kirsteins, Luanna Salk, MD  esomeprazole (NEXIUM) 40 MG capsule Take 40 mg by mouth daily before breakfast.    Suzy Bouchard, PA-C  gabapentin (NEURONTIN) 600 MG tablet  Take 600 mg by mouth 4 (four) times daily.    Suzy Bouchard, PA-C  OLANZapine (ZYPREXA) 5 MG tablet Take 0.5 tablets (2.5 mg total) by mouth 2 (two) times daily. 07/26/18   Patrecia Pour, NP  pravastatin (PRAVACHOL) 20 MG tablet Take 20 mg by mouth daily.    Suzy Bouchard, PA-C  pregabalin (LYRICA) 75 MG capsule Take 1 capsule (75 mg total) by mouth 2 (two) times daily. 06/21/12   Kirsteins, Luanna Salk, MD    Family History History reviewed. No pertinent family history.  Social History Social History   Tobacco Use  . Smoking status: Current Every Day Smoker    Types: Cigarettes    Start date: 06/30/2011  . Smokeless tobacco: Never Used  Substance Use Topics  . Alcohol use: Not on file  . Drug use: Not on file     Allergies   Morphine and related   Review of Systems Review of Systems  Ten systems reviewed and are negative for acute change, except as noted in the HPI.   Physical Exam Updated Vital Signs BP 128/87 (BP Location: Right Arm)   Pulse 89   Temp 98.5 F (36.9 C) (Oral)   Resp 17   SpO2 96%   Physical Exam Vitals signs and nursing note reviewed.  Constitutional:      General: He is not in acute distress.    Appearance: He is well-developed. He is not diaphoretic.     Comments: Sleepy  but easily awakened  HENT:     Head: Normocephalic and atraumatic.  Eyes:     General: No scleral icterus.    Conjunctiva/sclera: Conjunctivae normal.  Neck:     Musculoskeletal: Normal range of motion and neck supple.  Cardiovascular:     Rate and Rhythm: Normal rate and regular rhythm.     Heart sounds: Normal heart sounds.  Pulmonary:     Effort: Pulmonary effort is normal. No respiratory distress.     Breath sounds: Normal breath sounds.  Abdominal:     Palpations: Abdomen is soft.     Tenderness: There is no abdominal tenderness.  Musculoskeletal:     Comments: Right foot with small blister on the plantar surface without signs of infection  Skin:     General: Skin is warm and dry.  Neurological:     Mental Status: He is alert.  Psychiatric:        Behavior: Behavior normal.      ED Treatments / Results  Labs (all labs ordered are listed, but only abnormal results are displayed) Labs Reviewed  COMPREHENSIVE METABOLIC PANEL - Abnormal; Notable for the following components:      Result Value   Glucose, Bld 128 (*)    BUN 22 (*)    All other components within normal limits  RAPID URINE DRUG SCREEN, HOSP PERFORMED - Abnormal; Notable for the following components:   Amphetamines POSITIVE (*)    Tetrahydrocannabinol POSITIVE (*)    All other components within normal limits  CBC WITH DIFFERENTIAL/PLATELET - Abnormal; Notable for the following components:   WBC 17.8 (*)    Neutro Abs 10.7 (*)    Lymphs Abs 4.7 (*)    Monocytes Absolute 2.0 (*)    All other components within normal limits  ACETAMINOPHEN LEVEL - Abnormal; Notable for the following components:   Acetaminophen (Tylenol), Serum <10 (*)    All other components within normal limits  ETHANOL  SALICYLATE LEVEL    EKG None  Radiology No results found.  Procedures Procedures (including critical care time)  Medications Ordered in ED Medications - No data to display   Initial Impression / Assessment and Plan / ED Course  I have reviewed the triage vital signs and the nursing notes.  Pertinent labs & imaging results that were available during my care of the patient were reviewed by me and considered in my medical decision making (see chart for details).       Have reviewed the patient's labs which shows slightly elevated blood glucose, urine is positive for amphetamines and THC.  Elevated white blood cell count without fever.  Medically clear for TTS evaluation  Final Clinical Impressions(s) / ED Diagnoses   Final diagnoses:  Homicidal ideations    ED Discharge Orders    None       Margarita Mail, PA-C 07/27/18 0001    Daleen Bo, MD  07/29/18 [phone]

## 2018-07-26 NOTE — ED Notes (Signed)
Pt woke up and took his medication reluctantly.  I encouraged him to provide urine sample.

## 2018-07-26 NOTE — Discharge Instructions (Signed)
For your mental health needs, you are advised to follow up with [facility].  Call them at your earliest opportunity to schedule an intake appointment:       [place]. [address]      [place], [place] [address]      [phone]      Crisis number: [phone]  For your shelter needs, contact the following service providers:       [facility] (operated by [facility])      [place], [place] [address]      [phone]       [facility]      [address]      [place], [place] [address]      [phone]      This facility has beds available today, but to take advantage of their services, you must go directly there from the Emergency Department.  For day shelter and other supportive services for the homeless, contact the [facility]):       [facility]      [place], [place] [address]      [phone]  For transitional housing, Secondary school teacher.  They provide longer term housing than a shelter, but there is an application process:       [facility] [employer] [facility] [place]. [address], [place] [address]      [phone]

## 2018-07-27 ENCOUNTER — Encounter (HOSPITAL_COMMUNITY): Payer: Self-pay | Admitting: Behavioral Health

## 2018-07-27 MED ORDER — CYCLOBENZAPRINE HCL 10 MG PO TABS: 10.0000 mg | ORAL_TABLET | Freq: Three times a day (TID) | ORAL | Status: DC | PRN

## 2018-07-27 MED ORDER — DASATINIB 100 MG PO TABS: 100.0000 mg | ORAL_TABLET | Freq: Every day | ORAL | Status: DC

## 2018-07-27 MED ORDER — PRAVASTATIN SODIUM 20 MG PO TABS
20.0000 mg | ORAL_TABLET | Freq: Every day | ORAL | Status: DC
  Administered 2018-07-27: 20 mg via ORAL
  Filled 2018-07-27: qty 1

## 2018-07-27 MED ORDER — IBUPROFEN 200 MG PO TABS: 600.0000 mg | ORAL_TABLET | Freq: Three times a day (TID) | ORAL | Status: DC | PRN

## 2018-07-27 MED ORDER — OLANZAPINE 2.5 MG PO TABS
2.5000 mg | ORAL_TABLET | Freq: Two times a day (BID) | ORAL | Status: DC
  Administered 2018-07-27: 2.5 mg via ORAL
  Filled 2018-07-27 (×2): qty 1

## 2018-07-27 MED ORDER — ALUM & MAG HYDROXIDE-SIMETH 200-200-20 MG/5ML PO SUSP: 30.0000 mL | Freq: Four times a day (QID) | ORAL | Status: DC | PRN

## 2018-07-27 MED ORDER — NICOTINE 21 MG/24HR TD PT24: 21.0000 mg | MEDICATED_PATCH | Freq: Every day | TRANSDERMAL | Status: DC

## 2018-07-27 MED ORDER — PANTOPRAZOLE SODIUM 40 MG PO TBEC
40.0000 mg | DELAYED_RELEASE_TABLET | Freq: Every day | ORAL | Status: DC
  Administered 2018-07-27: 40 mg via ORAL
  Filled 2018-07-27: qty 1

## 2018-07-27 NOTE — ED Provider Notes (Signed)
Pt observed overnight and re-evaluated by TTS.  They feel pt is saying the things he is saying because of secondary gain.  Pt is cleared by psych for d/c.  Pt will be d/c home with a resource guide.  Return if worse.   Isla Pence, MD 07/27/18 1003

## 2018-07-27 NOTE — ED Notes (Signed)
Pt is irritable, but did wake up to talk with TTS.  Gait impaired secondary apparently to blister on right foot.  No complaints voiced except wants to be left alone so that he can sleep.  Was irritable when this writer woke him up to speak with TTS, but was able to be polite with TTS.

## 2018-07-27 NOTE — ED Notes (Addendum)
Attempted tele-assessment. Pt uncooperative during assessment.  Re-attempt in am. RN informed.
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
## 2018-07-27 NOTE — ED Notes (Signed)
Pt uncooperative during TTS assessment.  To re-attempt in am.

## 2018-07-27 NOTE — ED Notes (Signed)
Pt resistant to D/C. Required security and [employer] to get him to leave. He did finally walk out. Refused to sign out.  This Probation officer tired to impress upon him that [facility] can take him today if he goes straight there from here. This according to Tonette Bihari. Address included in Discharge Instructions. Pt refused to hear this information. D/C instructions put in his belongings bag. All belongings returned to pt. Pt would not take his belongings as he left. Security carried them for him.

## 2018-07-27 NOTE — BH Assessment (Signed)
[facility] Assessment Progress Note  Per Buford Dresser, DO, this pt does not require psychiatric hospitalization at this time.  Pt is to be discharged from [facility] with referral information for [facility], as well as information regarding area supportive services for the homeless.  This has been included in pt's discharge instructions.  Pt's nurse, Diane, has been notified.  Jalene Mullet, [place] Triage Specialist [phone]

## 2018-07-27 NOTE — Discharge Instructions (Signed)
For your mental health needs, you are advised to follow up with [facility].  Call them at your earliest opportunity to schedule an intake appointment:       [place]. [address]      [place], [place] [address]      [phone]      Crisis number: [phone]  For your shelter needs, contact the following service providers:       [facility] (operated by [facility]      [place], [place] [address]      [phone]       [facility]      [address]      [place], [place] [address]      [phone]      This facility has beds available today, but to take advantage of their services, you must go directly there from the Emergency Department.  For day shelter and other supportive services for the homeless, contact the [facility]):       [facility]      [place], [place] [address]      [phone]  For transitional housing, Secondary school teacher.  They provide longer term housing than a shelter, but there is an application process:       [facility] [employer] [facility] [place]. [address], [place] [address]      [phone]

## 2018-07-28 ENCOUNTER — Emergency Department (HOSPITAL_COMMUNITY)
Admission: EM | Admit: 2018-07-28 | Discharge: 2018-07-28 | Disposition: A | Discharge status: Home / Self Care | Payer: Medicaid Other | Attending: Emergency Medicine | Admitting: Emergency Medicine

## 2018-07-28 ENCOUNTER — Encounter (HOSPITAL_COMMUNITY): Payer: Self-pay

## 2018-07-28 ENCOUNTER — Emergency Department (HOSPITAL_COMMUNITY)
Admission: EM | Admit: 2018-07-28 | Discharge: 2018-07-29 | Disposition: A | Discharge status: Home / Self Care | Payer: Medicaid Other | Source: Home / Self Care | Attending: Emergency Medicine | Admitting: Emergency Medicine

## 2018-07-28 ENCOUNTER — Other Ambulatory Visit: Payer: Self-pay

## 2018-07-28 DIAGNOSIS — R45851 Suicidal ideations: Secondary | ICD-10-CM | POA: Insufficient documentation

## 2018-07-28 DIAGNOSIS — F17210 Nicotine dependence, cigarettes, uncomplicated: Secondary | ICD-10-CM | POA: Insufficient documentation

## 2018-07-28 DIAGNOSIS — Z008 Encounter for other general examination: Secondary | ICD-10-CM

## 2018-07-28 DIAGNOSIS — F1920 Other psychoactive substance dependence, uncomplicated: Secondary | ICD-10-CM | POA: Insufficient documentation

## 2018-07-28 DIAGNOSIS — Z590 Homelessness: Secondary | ICD-10-CM | POA: Insufficient documentation

## 2018-07-28 DIAGNOSIS — F22 Delusional disorders: Secondary | ICD-10-CM | POA: Insufficient documentation

## 2018-07-28 DIAGNOSIS — Z856 Personal history of leukemia: Secondary | ICD-10-CM | POA: Insufficient documentation

## 2018-07-28 LAB — CBC
HCT: 44 % (ref 39.0–52.0)
Hemoglobin: 15.3 g/dL (ref 13.0–17.0)
MCH: 31.5 pg (ref 26.0–34.0)
MCHC: 34.8 g/dL (ref 30.0–36.0)
MCV: 90.7 fL (ref 80.0–100.0)
Platelets: 403 10*3/uL — ABNORMAL HIGH (ref 150–400)
RBC: 4.85 MIL/uL (ref 4.22–5.81)
RDW: 14.6 % (ref 11.5–15.5)
WBC: 13.5 10*3/uL — ABNORMAL HIGH (ref 4.0–10.5)
nRBC: 0 % (ref 0.0–0.2)

## 2018-07-28 LAB — RAPID URINE DRUG SCREEN, HOSP PERFORMED
Amphetamines: NOT DETECTED
Barbiturates: NOT DETECTED
Benzodiazepines: NOT DETECTED
Cocaine: NOT DETECTED
Opiates: NOT DETECTED
Tetrahydrocannabinol: NOT DETECTED

## 2018-07-28 LAB — COMPREHENSIVE METABOLIC PANEL
ALT: 39 U/L (ref 0–44)
AST: 40 U/L (ref 15–41)
Albumin: 4.1 g/dL (ref 3.5–5.0)
Alkaline Phosphatase: 74 U/L (ref 38–126)
Anion gap: 9 (ref 5–15)
BUN: 11 mg/dL (ref 6–20)
CO2: 28 mmol/L (ref 22–32)
Calcium: 9.6 mg/dL (ref 8.9–10.3)
Chloride: 100 mmol/L (ref 98–111)
Creatinine, Ser: 0.87 mg/dL (ref 0.61–1.24)
GFR calc Af Amer: >60 mL/min (ref >60)
GFR calc non Af Amer: >60 mL/min (ref >60)
Glucose, Bld: 156 mg/dL — ABNORMAL HIGH (ref 70–99)
Potassium: 4.6 mmol/L (ref 3.5–5.1)
Sodium: 137 mmol/L (ref 135–145)
Total Bilirubin: 0.7 mg/dL (ref 0.3–1.2)
Total Protein: 7.5 g/dL (ref 6.5–8.1)

## 2018-07-28 LAB — SALICYLATE LEVEL
Salicylate Lvl: <7 mg/dL (ref 2.8–30.0)
Salicylate Lvl: <7 mg/dL (ref 2.8–30.0)

## 2018-07-28 LAB — ACETAMINOPHEN LEVEL
Acetaminophen (Tylenol), Serum: <10 ug/mL — ABNORMAL LOW (ref 10–30)
Acetaminophen (Tylenol), Serum: <10 ug/mL — ABNORMAL LOW (ref 10–30)

## 2018-07-28 LAB — ETHANOL
Alcohol, Ethyl (B): <10 mg/dL (ref <10)
Alcohol, Ethyl (B): <10 mg/dL (ref <10)

## 2018-07-28 NOTE — Discharge Instructions (Addendum)
Please come back to the emergency department for any new or worsening symptoms.

## 2018-07-28 NOTE — ED Notes (Signed)
TTS at pt's bedside

## 2018-07-28 NOTE — ED Triage Notes (Addendum)
Pt presents to ED c/o broken heart, pregnant with twins and claims to just have had triplets. Pt is experiencing flight of ideas, religiosity. Pt says he drinks as much as he can get his hands on.

## 2018-07-28 NOTE — ED Notes (Signed)
RN tried to calm pt down dt inappropriate comments. Pt stated to RN "If I want to jack my dick, I'm going to jack my dick."
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
## 2018-07-28 NOTE — ED Provider Notes (Signed)
[facility] EMERGENCY DEPARTMENT Provider Note   CSN: [id number] Arrival date & time: 07/28/18  5366    History   Chief Complaint Chief Complaint  Patient presents with  . Suicidal    HPI Randall [name] is a 53 y.o. male.     HPI  Patient is a 53 year old male with past medical history of CML, amphetamine abuse, presenting for suicidal ideations.  Patient reports that he was "murdered" and "went to heaven" and now he wants to "blow up everyone" that he can go back to heaven.  He otherwise does not have any specific plan for his suicidality.  He reports he is felt like he wants to die for a long time.  He reports he is currently homeless.  He does not know where his family is.  He reports that he currently uses methamphetamine but denies alcohol or any other illicit drug use.  No other medical complaints today. He does perseverate on "resurrection".  Does not know when last methamphetamine use was.   Past Medical History:  Diagnosis Date  . 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 CML (chronic myelocytic leukemia) ([place]) 12/30/2011  . Cervical spondylosis without myelopathy 12/30/2011  . Myalgia and myositis, unspecified 12/30/2011    Past Surgical History:  Procedure Laterality Date  . 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 Authorizing Provider  cyclobenzaprine (FLEXERIL) 10 MG tablet Take 1 tablet (10 mg total) by mouth 3 (three) times daily as needed for muscle spasms. Patient not taking: Reported on 07/27/2018 06/21/12   Charlett Blake, MD  diclofenac sodium (VOLTAREN) 1 % GEL Apply 2 g topically 4 (four) times daily. Patient not taking: Reported on 07/27/2018 06/21/12   Charlett Blake, MD   OLANZapine (ZYPREXA) 5 MG tablet Take 0.5 tablets (2.5 mg total) by mouth 2 (two) times daily. Patient not taking: Reported on 07/27/2018 07/26/18   Patrecia Pour, NP  pregabalin (LYRICA) 75 MG capsule Take 1 capsule (75 mg total) by mouth 2 (two) times daily. Patient not taking: Reported on 07/27/2018 06/21/12   Charlett Blake, MD    Family History History reviewed. No pertinent family history.  Social History Social History   Tobacco Use  . Smoking status: Current Every Day Smoker    Types: Cigarettes    Start date: 06/30/2011  . Smokeless tobacco: Never Used  Substance Use Topics  . Alcohol use: Not Currently  . Drug use: Yes    Types: Marijuana, Methamphetamines    Comment: patient is evasive in disclosing specifics concerning his drug use     Allergies   Acetaminophen; Morphine; and Morphine and related   Review of Systems Review of Systems  Constitutional: Negative for chills and fever.  Eyes: Negative for visual disturbance.  Psychiatric/Behavioral: Positive for suicidal ideas. Negative for agitation and self-injury. The patient is not nervous/anxious.   All other systems reviewed and are negative.    Physical Exam Updated Vital Signs BP 136/81 (BP Location: Left Arm)   Pulse 67   Temp (!) 97.5 F (36.4 C) (Oral)   Resp 16   SpO2 97%   Physical Exam Vitals signs and nursing note reviewed.  Constitutional:  General: He is not in acute distress.    Appearance: He is well-developed.     Comments: Patient tearful on exam.   HENT:     Head: Normocephalic and atraumatic.  Eyes:     Conjunctiva/sclera: Conjunctivae normal.     Pupils: Pupils are equal, round, and reactive to light.  Neck:     Musculoskeletal: Normal range of motion and neck supple.  Cardiovascular:     Rate and Rhythm: Normal rate and regular rhythm.     Pulses: Normal pulses.     Heart sounds: S1 normal and S2 normal. No murmur.  Pulmonary:     Effort: Pulmonary effort is  normal.     Breath sounds: Normal breath sounds. No wheezing or rales.  Abdominal:     General: There is no distension.     Palpations: Abdomen is soft.     Tenderness: There is no abdominal tenderness. There is no guarding.  Musculoskeletal: Normal range of motion.        General: No deformity.  Lymphadenopathy:     Cervical: No cervical adenopathy.  Skin:    General: Skin is warm and dry.     Findings: No erythema or rash.  Neurological:     Mental Status: He is alert.     Comments: Cranial nerves grossly intact. Patient moves extremities symmetrically and with good coordination.  Psychiatric:     Comments: Alert and oriented x3.  Patient perseverates on "Resurrection".  He does not identify any specific suicidal ideas, but does report wanting to "blow everything up".  He is not responding to internal stimuli, but also appearing to have delusions.  Normal speech.  Affect appropriate and not labile.      ED Treatments / Results  Labs (all labs ordered are listed, but only abnormal results are displayed) Labs Reviewed  ACETAMINOPHEN LEVEL - Abnormal; Notable for the following components:      Result Value   Acetaminophen (Tylenol), Serum <10 (*)    All other components within normal limits  ETHANOL  SALICYLATE LEVEL    EKG None  Radiology No results found.  Procedures Procedures (including critical care time)  Medications Ordered in ED Medications - No data to display   Initial Impression / Assessment and Plan / ED Course  I have reviewed the triage vital signs and the nursing notes.  Pertinent labs & imaging results that were available during my care of the patient were reviewed by me and considered in my medical decision making (see chart for details).  Clinical Course as of Jul 27 1212  Wed Jul 28, 2018  1134 Acetaminophen (Tylenol), S(!): <61 [AM]  4431 Salicylate Lvl: <5.4 [AM]    Clinical Course User Index [AM] Albesa Seen, PA-C       This is  a well-appearing 53 year old male with past medical history of CML, phentermine abuse, homelessness presenting for suicidal ideations.  He appears to be more delusional with mild psychosis today.  Possibly substance-induced as this was not noted previous visits.  Suicidality is not well-defined.  Given the development of psychosis, will discuss patient with TTS, but anticipate that patient will require a period of metabolization.  11:11 AM Patient more coherent.  He was evaluated by TTS who discussed case with Mordecai Maes, NP.  Feel this is reasonable.  Patient will be discharged after APAP and salicylate levels return.   Salicylates, APAP, and ethanol are negative.  Patient is fully cleared by TTS.  He has resources  for outpatient psychiatric care.  Patient is in understanding and agrees with the plan of care.  This is a supervised visit with Dr. Veryl Speak. Evaluation, management, and discharge planning discussed with this attending physician.  Final Clinical Impressions(s) / ED Diagnoses   Final diagnoses:  Polysubstance (excluding opioids) dependence, daily use [facility])    ED Discharge Orders    None       Tamala Julian 07/28/18 1223    Veryl Speak, MD 07/28/18 [phone]
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
## 2018-07-28 NOTE — ED Notes (Signed)
Pt given food and drink.

## 2018-07-28 NOTE — ED Notes (Signed)
Pt wanded and cleared by security.  

## 2018-07-28 NOTE — ED Notes (Signed)
Pt states he does not want to leave, that he is suicidal- has been seen at [facility] ED twice yesterday-  Security called and escorted pt out.

## 2018-07-28 NOTE — ED Provider Notes (Signed)
[facility] EMERGENCY DEPARTMENT Provider Note   CSN: [id number] Arrival date & time: 07/28/18  1924    History   Chief Complaint Chief Complaint  Patient presents with  . Psychiatric Evaluation    HPI Randall [name] is a 53 y.o. male.with a hx of CML, amphetamine abuse, & tobacco abuse who presents to the ED for psychiatric evaluation. Patient states he needs to be evaluated.  He states he has a broken heart and that he is unstable. He is tangential and difficult to redirect. He is making inappropriate/delusional statements such as that he is pregnant with twins and that he has babies in the blanket he has with him. He states he is suppose to be on psychiatric medicines but does not take them. No specific reports of SI/HI on initial assessment. He denies head injury. Reports he tries to drink as much as he can daily and that he would like to do drugs now but does not answer me when asked if he had used any today.   Level 5 caveat secondary to psychosis vs. Lack of cooperation.      HPI  No past medical history on file.  There are no active problems to display for this patient.   History reviewed. No pertinent surgical history.      Home Medications    Prior to Admission medications   Not on File    Family History No family history on file.  Social History Social History   Tobacco Use  . Smoking status: Not on file  Substance Use Topics  . Alcohol use: Not on file  . Drug use: Not on file     Allergies   Patient has no allergy information on record.   Review of Systems Review of Systems  Unable to perform ROS: Psychiatric disorder   Physical Exam Updated Vital Signs BP (!) 172/95   Pulse 94   Temp 98 F (36.7 C) (Oral)   Resp 18   SpO2 98%   Physical Exam Vitals signs and nursing note reviewed.  Constitutional:      General: He is not in acute distress.    Appearance: He is well-developed. He is not toxic-appearing.   HENT:     Head: Normocephalic and atraumatic.  Eyes:     General:        Right eye: No discharge.        Left eye: No discharge.     Conjunctiva/sclera: Conjunctivae normal.  Neck:     Musculoskeletal: Neck supple.  Cardiovascular:     Rate and Rhythm: Normal rate and regular rhythm.  Pulmonary:     Effort: Pulmonary effort is normal. No respiratory distress.     Breath sounds: Normal breath sounds. No wheezing, rhonchi or rales.  Abdominal:     General: There is no distension.     Palpations: Abdomen is soft.     Tenderness: There is no abdominal tenderness.  Skin:    General: Skin is warm and dry.     Findings: No rash.  Neurological:     General: No focal deficit present.     Mental Status: He is alert.     Comments:  Ambulatory. CN III-XII grossly intact. Symmetric strength/sensation x 4. Negative pronator drift. Ambulatory. Patient oriented to person, & place, refusing to answer questions about time.   Psychiatric:        Attention and Perception: He is inattentive.        Speech: Speech is  rapid and pressured and tangential.        Behavior: Behavior is hyperactive.    ED Treatments / Results  Labs (all labs ordered are listed, but only abnormal results are displayed) Labs Reviewed  CBC - Abnormal; Notable for the following components:      Result Value   WBC 13.5 (*)    Platelets 403 (*)    All other components within normal limits  COMPREHENSIVE METABOLIC PANEL - Abnormal; Notable for the following components:   Glucose, Bld 156 (*)    All other components within normal limits  ACETAMINOPHEN LEVEL - Abnormal; Notable for the following components:   Acetaminophen (Tylenol), Serum <10 (*)    All other components within normal limits  ETHANOL  RAPID URINE DRUG SCREEN, HOSP PERFORMED  SALICYLATE LEVEL    EKG EKG Interpretation  Date/Time:  Wednesday Jul 28 2018 20:06:22 EDT Ventricular Rate:  75 PR Interval:    QRS Duration: 98 QT Interval:  381 QTC  Calculation: 426 R Axis:   81 Text Interpretation:  Sinus rhythm Borderline right axis deviation ST elevation, consider anterior injury Confirmed by Dene Gentry [phone]) on 07/28/2018 8:26:07 PM   Radiology No results found.  Procedures Procedures (including critical care time)  Medications Ordered in ED Medications - No data to display  Initial Impression / Assessment and Plan / ED Course  I have reviewed the triage vital signs and the nursing notes.  Pertinent labs & imaging results that were available during my care of the patient were reviewed by me and considered in my medical decision making (see chart for details).   Patient presents to the emergency department requesting psychiatric evaluation.  He is tangential with rapid/pressured speech.  He is making delusional statements.  He is nontoxic-appearing, no apparent distress, vitals WNL with the exception of his elevated blood pressure but is frequently moving his arm while cuff is measuring pressure, low suspicion for HTN emergency, repeat pressure taken by me 140/88. Patient is tangential and making delusional statements however he is alert w/ clear speech & without focal neurologic deficits, no signs of head trauma, prior visit notes w/ similar upon chart merging,  do not suspect ischemic CVA or bleed. He mentions a "broken heart" which has been mentioned during prior visits, EKG was obtained primarily to evaluate intervals in case of need for antipsychotics, this was reviewed by Dr. Francia Greaves and not felt to be STEMI- patient not w/ chest pain, just states "broken heart" similar to prior visits .  Plan for labs & TTS consultation.   Labs fairly unremarkable.  Leukocytosis improved from prior.   Upon merging of patient's charts it appears that this is patient's 4th ED visit for psychiatric evaluation in the past 48 hours.  Visits have been reviewed and he appears to have similar presentations. 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 Will consult TTS.   Patient making inappropriate comments and gestures to staff.   He was overall uncooperative during TTS assessment, speaking spanish when he has been speaking english throughout ER stay, he was offered a Optometrist and declined.   I spoke with Curlene Dolphin LCAS & [name] [facility] NP- he was overall uncooperative for their assessment, they have reviewed his prior [facility] assessments, he has been psychiatrically cleared for discharge.   Upon my re-evaluation of the patient to inform him of plan for discharge with resources he began making vague homicidal comments requesting that I  provide him with a hit list and that he would harm others if discharged. He has not provided any specifics in terms of plans or a specific individual he would like to harm. No SI was expressed to me. He did not make any homicidal statements throughout ER stay until time of discharge to my knowledge. He has been suspected to be presenting w/ intent of secondary gain per prior notes. He has no documented history of homicidal or suicidal gestures that I am able to locate. Given these comments were not made until time of discharge, he has been psychiatrically cleared, and he has been uncooperative throughout stay and inappropriate with staff will discharge at this time. Resources were provided.   Findings and plan of care discussed with supervising physician Dr. Francia Greaves.   Final Clinical Impressions(s) / ED Diagnoses   Final diagnoses:  Encounter for psychological evaluation    ED Discharge Orders    None       Amaryllis Dyke, PA-C 07/29/18 0039    Valarie Merino, MD 08/03/18 [phone]

## 2018-07-28 NOTE — ED Notes (Signed)
Pt given body soap and towel to wash with.

## 2018-07-28 NOTE — ED Notes (Signed)
Pt changed into purple scrubs 

## 2018-07-28 NOTE — ED Notes (Signed)
Pt asking nursing staff if they can assist him in the bathroom, making inappropriate comments, and laughing.

## 2018-07-29 ENCOUNTER — Observation Stay (HOSPITAL_COMMUNITY): Admission: AD | Admit: 2018-07-29 | Payer: Medicaid Other | Source: Intra-hospital | Admitting: Psychiatry

## 2018-07-29 ENCOUNTER — Other Ambulatory Visit: Payer: Self-pay | Admitting: Behavioral Health

## 2018-07-29 ENCOUNTER — Emergency Department (HOSPITAL_COMMUNITY)
Admission: EM | Admit: 2018-07-29 | Discharge: 2018-07-29 | Disposition: A | Discharge status: Other Acute Inpatient Hospital | Payer: Medicaid Other | Attending: Emergency Medicine | Admitting: Emergency Medicine

## 2018-07-29 ENCOUNTER — Inpatient Hospital Stay (HOSPITAL_COMMUNITY)
Admission: AD | Admit: 2018-07-29 | Discharge: 2018-08-04 | DRG: 885 | Disposition: A | Discharge status: Home / Self Care | Payer: Medicaid Other | Source: Intra-hospital | Attending: Psychiatry | Admitting: Psychiatry

## 2018-07-29 ENCOUNTER — Other Ambulatory Visit: Payer: Self-pay

## 2018-07-29 ENCOUNTER — Encounter (HOSPITAL_COMMUNITY): Payer: Self-pay | Admitting: Student

## 2018-07-29 ENCOUNTER — Encounter (HOSPITAL_COMMUNITY): Payer: Self-pay | Admitting: Emergency Medicine

## 2018-07-29 ENCOUNTER — Encounter (HOSPITAL_COMMUNITY): Payer: Self-pay

## 2018-07-29 DIAGNOSIS — F19959 Other psychoactive substance use, unspecified with psychoactive substance-induced psychotic disorder, unspecified: Secondary | ICD-10-CM

## 2018-07-29 DIAGNOSIS — R45850 Homicidal ideations: Secondary | ICD-10-CM | POA: Diagnosis present

## 2018-07-29 DIAGNOSIS — F29 Unspecified psychosis not due to a substance or known physiological condition: Secondary | ICD-10-CM | POA: Diagnosis not present

## 2018-07-29 DIAGNOSIS — F3289 Other specified depressive episodes: Secondary | ICD-10-CM

## 2018-07-29 DIAGNOSIS — T1491XA Suicide attempt, initial encounter: Secondary | ICD-10-CM | POA: Diagnosis not present

## 2018-07-29 DIAGNOSIS — F329 Major depressive disorder, single episode, unspecified: Secondary | ICD-10-CM | POA: Diagnosis not present

## 2018-07-29 DIAGNOSIS — Z915 Personal history of self-harm: Secondary | ICD-10-CM

## 2018-07-29 DIAGNOSIS — R042 Hemoptysis: Secondary | ICD-10-CM

## 2018-07-29 DIAGNOSIS — Z1159 Encounter for screening for other viral diseases: Secondary | ICD-10-CM | POA: Diagnosis not present

## 2018-07-29 DIAGNOSIS — F17210 Nicotine dependence, cigarettes, uncomplicated: Secondary | ICD-10-CM | POA: Diagnosis present

## 2018-07-29 DIAGNOSIS — G4700 Insomnia, unspecified: Secondary | ICD-10-CM | POA: Diagnosis present

## 2018-07-29 DIAGNOSIS — Z590 Homelessness: Secondary | ICD-10-CM

## 2018-07-29 DIAGNOSIS — R45851 Suicidal ideations: Secondary | ICD-10-CM | POA: Diagnosis present

## 2018-07-29 DIAGNOSIS — F250 Schizoaffective disorder, bipolar type: Secondary | ICD-10-CM | POA: Diagnosis present

## 2018-07-29 DIAGNOSIS — Z856 Personal history of leukemia: Secondary | ICD-10-CM | POA: Insufficient documentation

## 2018-07-29 LAB — SARS CORONAVIRUS 2 BY RT PCR (HOSPITAL ORDER, PERFORMED IN ~~LOC~~ HOSPITAL LAB): SARS Coronavirus 2: NEGATIVE

## 2018-07-29 MED ORDER — HYDROXYZINE HCL 25 MG PO TABS
25.0000 mg | ORAL_TABLET | Freq: Four times a day (QID) | ORAL | Status: AC | PRN
  Administered 2018-07-31: 21:00:00 25 mg via ORAL
  Filled 2018-07-29: qty 1

## 2018-07-29 MED ORDER — OLANZAPINE 5 MG PO TBDP
5.0000 mg | ORAL_TABLET | Freq: Three times a day (TID) | ORAL | Status: DC | PRN
  Administered 2018-08-01: 12:00:00 5 mg via ORAL
  Filled 2018-07-29: qty 1

## 2018-07-29 MED ORDER — THIAMINE HCL 100 MG/ML IJ SOLN: 100.0000 mg | Freq: Once | INTRAMUSCULAR | Status: DC

## 2018-07-29 MED ORDER — ALUM & MAG HYDROXIDE-SIMETH 200-200-20 MG/5ML PO SUSP
30.0000 mL | ORAL | Status: DC | PRN
  Administered 2018-07-31: 22:00:00 30 mL via ORAL
  Filled 2018-07-29: qty 30

## 2018-07-29 MED ORDER — DULOXETINE HCL 20 MG PO CPEP
20.0000 mg | ORAL_CAPSULE | Freq: Every day | ORAL | Status: DC
  Administered 2018-07-30: 08:00:00 20 mg via ORAL
  Filled 2018-07-29 (×2): qty 1

## 2018-07-29 MED ORDER — OLANZAPINE 5 MG PO TABS
2.5000 mg | ORAL_TABLET | Freq: Two times a day (BID) | ORAL | Status: DC
  Administered 2018-07-29: 10:00:00 2.5 mg via ORAL
  Filled 2018-07-29: qty 1

## 2018-07-29 MED ORDER — LORAZEPAM 1 MG PO TABS
1.0000 mg | ORAL_TABLET | ORAL | Status: AC | PRN
  Administered 2018-07-31: 22:00:00 1 mg via ORAL
  Filled 2018-07-29: qty 1

## 2018-07-29 MED ORDER — LORAZEPAM 1 MG PO TABS
1.0000 mg | ORAL_TABLET | Freq: Four times a day (QID) | ORAL | Status: AC | PRN
  Administered 2018-07-30: 11:00:00 1 mg via ORAL
  Filled 2018-07-29 (×2): qty 1

## 2018-07-29 MED ORDER — ZIPRASIDONE MESYLATE 20 MG IM SOLR: 10.0000 mg | INTRAMUSCULAR | Status: DC | PRN

## 2018-07-29 MED ORDER — OLANZAPINE 5 MG PO TABS
5.0000 mg | ORAL_TABLET | Freq: Every day | ORAL | Status: DC
  Administered 2018-07-29: 21:00:00 5 mg via ORAL
  Filled 2018-07-29 (×2): qty 1

## 2018-07-29 MED ORDER — VITAMIN B-1 100 MG PO TABS
100.0000 mg | ORAL_TABLET | Freq: Every day | ORAL | Status: DC
  Administered 2018-07-30 – 2018-08-04 (×5): 100 mg via ORAL
  Filled 2018-07-29 (×9): qty 1

## 2018-07-29 MED ORDER — PREGABALIN 25 MG PO CAPS
75.0000 mg | ORAL_CAPSULE | Freq: Two times a day (BID) | ORAL | Status: DC
  Administered 2018-07-29: 75 mg via ORAL
  Filled 2018-07-29: qty 3

## 2018-07-29 MED ORDER — OLANZAPINE 2.5 MG PO TABS
2.5000 mg | ORAL_TABLET | Freq: Every day | ORAL | Status: DC
  Administered 2018-07-30: 08:00:00 2.5 mg via ORAL
  Filled 2018-07-29 (×2): qty 1

## 2018-07-29 MED ORDER — ADULT MULTIVITAMIN W/MINERALS CH
1.0000 | ORAL_TABLET | Freq: Every day | ORAL | Status: DC
  Administered 2018-07-29 – 2018-08-04 (×6): 1 via ORAL
  Filled 2018-07-29 (×9): qty 1

## 2018-07-29 NOTE — ED Notes (Signed)
Pt discharged to [facility] with transport via [place] and paperwork given to [employer].

## 2018-07-29 NOTE — Tx Team (Signed)
Initial Treatment Plan 07/29/2018 1:11 PM Zayvien Canning EFU:[id number]    PATIENT STRESSORS: Financial difficulties Health problems Marital or family conflict Medication change or noncompliance   PATIENT STRENGTHS: Capable of independent living Communication skills   PATIENT IDENTIFIED PROBLEMS: "Positive motivation"  "talk about my woes"  Suicidal Ideation  Depression               DISCHARGE CRITERIA:  Ability to meet basic life and health needs Motivation to continue treatment in a less acute level of care  PRELIMINARY DISCHARGE PLAN: Attend aftercare/continuing care group Attend PHP/IOP Outpatient therapy  PATIENT/FAMILY INVOLVEMENT: This treatment plan has been presented to and reviewed with the patient, Ranferi Clingan, and/or family member.  The patient and family have been given the opportunity to ask questions and make suggestions.  Coralyn Mark Alandis Bluemel, RN 07/29/2018, 1:11 PM

## 2018-07-29 NOTE — ED Notes (Signed)
PT Stated multiple times that if he could not stay" he would kill and take hostage staff members before he would leave." Endorsed HI stating he was "ready to get started right now". Asked provider for "hit list" when offered help. Stated "we were not helping him and kicking him to the street". PT spoke clear english with this tech but would not for [employer].

## 2018-07-29 NOTE — ED Triage Notes (Signed)
Pt recently discharged for being uncooperative, pt found on park bench with cord around neck and unresponsive; pt now alert and talking non-stop, marks on neck

## 2018-07-29 NOTE — H&P (Signed)
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 Currently presents as vague/poor historian, with disorganized thought process. Patient was initially brought to ED by EMS on 5/25 .Was noted to be disorganized/ with  psychotic symptoms, stating that people were trying to kill him. UDS was positive for amphetamines and for cannabis. 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 He was  cleared/discharged . He re-presented to ED  initially reporting foot pain, homicidal and suicidal ideations, making statements of " killing everyone with a bomb" and later stating that he was pregnant with twins , also refusing to cooperate with discharge/making homicidal statements contingent on being discharged . Was discharged ,returned a few hours later following suicide attempt by tying cord around his neck, reporting people had " taken his disability".  At this time patient presents tired, sedated, but wakes up promptly by calling his name. His thought process is disorganized, with loose associations, and currently does not provide meaningful history. Does state he walked from [place] to [place] recently " because I was depressed", and endorses depression and some neuro-vegetative symptoms as below. He currently denies recent drug use or history of substance abuse, but as noted, recent UDS was positive for amphetamines and cannabis. Denies alcohol consumption and BALs were negative on 5/25 and 5/27.  Associated Signs/Symptoms: Depression Symptoms:  depressed mood, anhedonia, insomnia, suicidal thoughts without plan, loss of energy/fatigue, decreased appetite, (Hypo) Manic Symptoms:  Irritability,  insomnia Anxiety Symptoms:  Does not appear anxious or nervous at this time Psychotic Symptoms:  Denies hallucinations, presents with psychotic/disorganized thought process. 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 States " I have PTSD from every single war and soul". PTSD Symptoms: Does not clearly endorse PTSD symptoms Total Time spent with patient: 45 minutes  Past Psychiatric History: Reports prior psychiatric admissions, but does not remember when most recent admission was . States " it has been a while". States he has history of depression. Does not currently endorse history of prior suicidal attempts .Does not endorse history of mania or prior Bipolar Disorder diagnosis. Chart notes indicate history of prior psychiatric admission in 2016 for suicidal ideations, substance abuse, and chart notes indicate that secondary gain factors were also considered at that time. As per chart was discharged on Neurontin and Cymbalta at the time.  Is the patient at risk to self? Yes.    Has the patient been a risk to self in the past 6 months? Yes.    Has the patient been a risk to self within the distant past? No.  Is the patient a risk to others? Yes.    Has the patient been a risk to others in the past 6 months? No.  Has the patient been a risk to others within the distant past? No.   Prior Inpatient Therapy:  as above  Prior Outpatient Therapy:  unknown  Alcohol Screening: 1. How often do you have a drink containing alcohol?: Never 2. How many drinks containing alcohol do you have on a typical day when you are drinking?: 1 or 2 3. How often  do you have six or more drinks on one occasion?: Never AUDIT-C Score: 0 4. How often during the last year have you found that you were not able to stop drinking once you had started?: Never 5. How often  during the last year have you failed to do what was normally expected from you becasue of drinking?: Never 6. How often during the last year have you needed a first drink in the morning to get yourself going after a heavy drinking session?: Never 7. How often during the last year have you had a feeling of guilt of remorse after drinking?: Never 8. How often during the last year have you been unable to remember what happened the night before because you had been drinking?: Never 9. Have you or someone else been injured as a result of your drinking?: No 10. Has a relative or friend or a doctor or another health worker been concerned about your drinking or suggested you cut down?: No Alcohol Use Disorder Identification Test Final Score (AUDIT): 0 Substance Abuse History in the last 12 months:  Denies alcohol or drug abuse  Consequences of Substance Abuse: Denies  Previous Psychotropic Medications:  States he has not been taking medications in several months. Chart notes indicate he had been prescribed on Zyprexa 2.5 mgrs BID in the past . 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  Past Medical History:  Diagnosis Date  . 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 Currently homeless. On disability.  Social History   Substance and Sexual Activity  Alcohol Use Not Currently     Social History   Substance and Sexual Activity  Drug Use Yes  . Types: Marijuana, Methamphetamines   Comment: patient is evasive in disclosing specifics concerning his drug use    Additional Social  History:  Allergies:   Allergies  Allergen Reactions  . Acetaminophen Nausea And Vomiting  . Morphine Nausea And Vomiting  . Morphine And Related Nausea And Vomiting    (Per patient's other chart in Epic- "Dior Stepter," MRN [id number])    . Tylenol [Acetaminophen] Nausea And Vomiting    (Per patient's other chart in Epic- "Ulysees Robarts," MRN [id number])  . Morphine And Related Nausea Only   Lab Results:  Results for orders placed or performed during the hospital encounter of 07/29/18 (from the past 48 hour(s))  SARS Coronavirus 2 (CEPHEID - Performed in [facility] hospital lab), Hosp Order     Status: None   Collection Time: 07/29/18  5:57 AM  Result Value Ref Range   SARS Coronavirus 2 NEGATIVE NEGATIVE    Comment: (NOTE) If result is NEGATIVE SARS-CoV-2 target nucleic acids are NOT DETECTED. The SARS-CoV-2 RNA is generally detectable in upper and lower  respiratory specimens during the acute phase of infection. The lowest  concentration of SARS-CoV-2 viral copies this assay can detect is 250  copies / mL. A negative result does not preclude SARS-CoV-2 infection  and should not be used as the sole basis for treatment or other  patient management decisions.  A negative result  may occur with  improper specimen collection / handling, submission of specimen other  than nasopharyngeal swab, presence of viral mutation(s) within the  areas targeted by this assay, and inadequate number of viral copies  (<250 copies / mL). A negative result must be combined with clinical  observations, patient history, and epidemiological information. If result is POSITIVE SARS-CoV-2 target nucleic acids are DETECTED. The SARS-CoV-2 RNA is generally detectable in upper and lower  respiratory specimens dur ing the acute phase of infection.  Positive  results are indicative of active infection with SARS-CoV-2.  Clinical  correlation with patient history and other diagnostic information is   necessary to determine patient infection status.  Positive results do  not rule out bacterial infection or co-infection with other viruses. If result is PRESUMPTIVE POSTIVE SARS-CoV-2 nucleic acids MAY BE PRESENT.   A presumptive positive result was obtained on the submitted specimen  and confirmed on repeat testing.  While 2019 novel coronavirus  (SARS-CoV-2) nucleic acids may be present in the submitted sample  additional confirmatory testing may be necessary for epidemiological  and / or clinical management purposes  to differentiate between  SARS-CoV-2 and other Sarbecovirus currently known to infect humans.  If clinically indicated additional testing with an alternate test  methodology [phone]) is advised. The SARS-CoV-2 RNA is generally  detectable in upper and lower respiratory sp ecimens during the acute  phase of infection. The expected result is Negative. Fact Sheet for Patients:  StrictlyIdeas.no Fact Sheet for Healthcare Providers: BankingDealers.co.za This test is not yet approved or cleared by the [place] FDA and has been authorized for detection and/or diagnosis of SARS-CoV-2 by FDA under an Emergency Use Authorization (EUA).  This EUA will remain in effect (meaning this test can be used) for the duration of the COVID-19 declaration under Section 564(b)(1) of the Act, 21 U.S.C. section 360bbb-3(b)(1), unless the authorization is terminated or revoked sooner. Performed at [facility], [place] [address], [place], [place] [address]     Blood Alcohol level:  Lab Results  Component Value Date   ETH <10 07/28/2018   ETH <10 00/86/7619    Metabolic Disorder Labs:  No results found for: HGBA1C, MPG No results found for: PROLACTIN No results found for: CHOL, TRIG, HDL, CHOLHDL, VLDL, LDLCALC  Current Medications: No current facility-administered medications for this encounter.    PTA  Medications: Medications Prior to Admission  Medication Sig Dispense Refill Last Dose  . cyclobenzaprine (FLEXERIL) 10 MG tablet Take 1 tablet (10 mg total) by mouth 3 (three) times daily as needed for muscle spasms. (Patient not taking: Reported on 07/27/2018) 90 tablet 1 Not Taking at Unknown time  . diclofenac sodium (VOLTAREN) 1 % GEL Apply 2 g topically 4 (four) times daily. (Patient not taking: Reported on 07/27/2018) 3 Tube 1 Not Taking at Unknown time  . OLANZapine (ZYPREXA) 5 MG tablet Take 0.5 tablets (2.5 mg total) by mouth 2 (two) times daily. (Patient not taking: Reported on 07/27/2018) 60 tablet 0 Not Taking at Unknown time  . pregabalin (LYRICA) 75 MG capsule Take 1 capsule (75 mg total) by mouth 2 (two) times daily. (Patient not taking: Reported on 07/27/2018) 60 capsule 1 Not Taking at Unknown time    Musculoskeletal: Strength & Muscle Tone: within normal limits no current tremors, diaphoresis, or psychomotor agitation noted  Gait & Station: normal Patient leans: N/A  Psychiatric Specialty Exam: Physical Exam  Review of Systems  Constitutional: Positive for malaise/fatigue. Negative for chills and  fever.       Reports feeling " tired a lot"  HENT: Negative.   Eyes: Negative.   Respiratory: Negative for cough and shortness of breath.   Cardiovascular: Negative for chest pain.  Gastrointestinal: Negative for nausea and vomiting.  Genitourinary: Negative.   Musculoskeletal: Negative.   Skin: Negative for rash.  Neurological: Negative for seizures and headaches.  Endo/Heme/Allergies: Negative.   Psychiatric/Behavioral: Positive for depression.    Blood pressure 120/83, pulse 92, temperature 98.6 F (37 C), temperature source Oral, resp. rate 18, height 5\' 7"  (1.702 m), weight 70.3 kg, SpO2 99 %.Body mass index is 24.28 kg/m.  General Appearance: Fairly Groomed  Eye Contact:  Fair  Speech:  pressured at times   Volume:  Normal  Mood:  reports feeling depressed   Affect:   irritable at times  Thought Process:  Disorganized and Descriptions of Associations: Loose  Orientation:  Other:  appears somewhat sedated, but wakes up easily by calling his name. He is oriented x 3 .  Thought Content:  Denies hallucinations. (+) disorganized thought process and delusional ideations   Suicidal Thoughts:  No currently denies suicidal or self injurious ideations, contracts for safety on unit, also denies current homicidal or violent ideations  Homicidal Thoughts:  No  Memory:  recent and remote fair   Judgement:  Impaired  Insight:  Lacking  Psychomotor Activity:  Decreased Currently not presenting with psychomotor agitation or restlessness   Concentration:  Concentration: Fair and Attention Span: Fair  Recall:  [employer] of Knowledge:  Fair  Language:  Fair  Akathisia:  Negative  Handed:  Right  AIMS (if indicated):     Assets:  Resilience  ADL's:  Intact  Cognition:  WNL  Sleep:       Treatment Plan Summary: Daily contact with patient to assess and evaluate symptoms and progress in treatment, Medication management, Plan inpatient treatment and medications as below  Observation Level/Precautions:  15 minute checks  Laboratory:  Lipid Panel, HgbA1C, TSH.  ( 5/28 EKG - NSR, QTc 436)  Psychotherapy:  Milieu, group therapy   Medications:   Zyprexa 2.5 mgrs QAM and 5 mgrs QHS. Cymbalta 20 mgrs QDAY initially ( as per chart was prescribed this medication in the past, and no reports of side effects) Agitation protocol for acute agitation as needed  Ativan PRN per CIWA protocol if needed As patient is poor historian / unable to provide significant information and substance abuse is suspected ,will start Ativan PRN for alcohol or BZD withdrawal if needed ( currently denies alcohol abuse , but chart notes indicate he did endorse drinking )   Consultations:  As needed  Discharge Concerns: - homelessness    Estimated LOS: 3-4 days   Other:     Physician Treatment Plan  for Primary Diagnosis: Psychosis, Unspecified, 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 Long Term Goal(s): Improvement in symptoms so as ready for discharge  Short Term Goals: Ability to identify changes in lifestyle to reduce recurrence of condition will improve, Ability to verbalize feelings will improve, Ability to disclose and discuss suicidal ideas, Ability to demonstrate self-control will improve, Ability to identify and develop effective coping behaviors will improve and Ability to maintain clinical measurements within normal limits will improve  Physician Treatment Plan for Secondary Diagnosis: Depression, consider Substance Induced Long Term Goal(s): Improvement in symptoms so as ready for discharge  Short Term Goals: Ability to identify changes in lifestyle to reduce recurrence of condition will improve, Ability to verbalize feelings  will improve, Ability to disclose and discuss suicidal ideas, Ability to demonstrate self-control will improve, Ability to identify and develop effective coping behaviors will improve and Ability to maintain clinical measurements within normal limits will improve  I certify that inpatient services furnished can reasonably be expected to improve the patient's condition.    Jenne Campus, MD 5/28/20201:37 PM

## 2018-07-29 NOTE — ED Provider Notes (Signed)
[facility] EMERGENCY DEPARTMENT Provider Note  CSN: [id number] Arrival date & time: 07/29/18 0214  Chief Complaint(s) Suicidal  HPI Randall [name] is a 53 y.o. male with a history of CML and polysubstance abuse who was just discharged from the emergency department after being evaluated by behavioral health for SI/HI.  He returns after a reported suicide attempt after being discharged.  Patient was found with a cord around his neck and unresponsive.  Patient reports that he has given up on life because people have taken his disability card.  Denies chest pain, SOB, neck pain, abd pain. No other complaints.  HPI  Past Medical History Past Medical History:  Diagnosis Date  . 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 CML (chronic myelocytic leukemia) ([place]) 12/30/2011  . Cervical spondylosis without myelopathy 12/30/2011  . Myalgia and myositis, unspecified 12/30/2011   Home Medication(s) Prior to Admission medications   Medication Sig Start Date End Date Taking? Authorizing Provider  cyclobenzaprine (FLEXERIL) 10 MG tablet Take 1 tablet (10 mg total) by mouth 3 (three) times daily as needed for muscle spasms. Patient not taking: Reported on 07/27/2018 06/21/12   Charlett Blake, MD  diclofenac sodium (VOLTAREN) 1 % GEL Apply 2 g topically 4 (four) times daily. Patient not taking: Reported on 07/27/2018 06/21/12   Charlett Blake, MD  OLANZapine (ZYPREXA) 5 MG tablet Take 0.5 tablets (2.5 mg total) by mouth 2 (two) times daily. Patient not taking: Reported on 07/27/2018 07/26/18   Patrecia Pour, NP  pregabalin (LYRICA) 75 MG capsule Take 1 capsule (75 mg total) by mouth 2 (two) times daily. Patient not taking: Reported on 07/27/2018 06/21/12   Charlett Blake, MD                                                                                           Past Surgical History Past Surgical History:  Procedure Laterality Date  . 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 No pertinent family history.  Social History Social History   Tobacco Use  . Smoking status: Current Every Day Smoker    Types: Cigarettes    Start date: 06/30/2011  . Smokeless tobacco: Never Used  Substance Use Topics  . Alcohol use: Not Currently  . Drug use: Yes    Types: Marijuana, Methamphetamines    Comment: patient is evasive in disclosing specifics concerning his drug use   Allergies Acetaminophen; Morphine; and Morphine and related  Review of Systems Review of Systems All other systems are reviewed and are negative for acute change except as noted in the HPI  Physical Exam Vital Signs  I have reviewed the triage vital signs BP (!) 140/96   Pulse 82   Temp 97.8 F (36.6 C) (Oral)   Resp 18   SpO2 99%   Physical Exam Vitals signs reviewed.  Constitutional:      General: He is not in acute distress.    Appearance: He is well-developed. He  is not diaphoretic.     Comments: Clothing wet  HENT:     Head: Normocephalic and atraumatic.     Jaw: No trismus.     Right Ear: External ear normal.     Left Ear: External ear normal.     Nose: Nose normal.  Eyes:     General: No scleral icterus.    Conjunctiva/sclera: Conjunctivae normal.  Neck:     Musculoskeletal: Normal range of motion.     Trachea: Phonation normal.     Comments: No marks on neck Cardiovascular:     Rate and Rhythm: Normal rate and regular rhythm.  Pulmonary:     Effort: Pulmonary effort is normal. No respiratory distress.     Breath sounds: No stridor.  Abdominal:     General: There is no distension.  Musculoskeletal: Normal range of motion.  Neurological:     Mental Status: He is alert and oriented to person, place, and time.  Psychiatric:        Behavior: Behavior  is uncooperative and agitated.     ED Results and Treatments Labs (all labs ordered are listed, but only abnormal results are displayed) Labs Reviewed  SARS CORONAVIRUS 2 (HOSPITAL ORDER, [facility])                                                                                                                         EKG  EKG Interpretation  Date/Time:  Thursday Jul 29 2018 02:17:12 EDT Ventricular Rate:  90 PR Interval:    QRS Duration: 95 QT Interval:  356 QTC Calculation: 436 R Axis:   69 Text Interpretation:  Age not entered, assumed to be  53 years old for purpose of ECG interpretation Sinus rhythm ST elev, probable normal early repol pattern No significant change since last tracing Confirmed by Addison Lank [phone]) on 07/29/2018 6:32:32 AM      Radiology No results found. Pertinent labs & imaging results that were available during my care of the patient were reviewed by me and considered in my medical decision making (see chart for details).  Medications Ordered in ED Medications  pregabalin (LYRICA) capsule 75 mg (has no administration in time range)  OLANZapine (ZYPREXA) tablet 2.5 mg (has no administration in time range)  Procedures Procedures  (including critical care time)  Medical Decision Making / ED Course I have reviewed the nursing notes for this encounter and the patient's prior records (if available in EHR or on provided paperwork).    EKG reassuring. No ligature marks noted on neck. Medically cleared. Outlook reevaluated patient and now recommend observation.   Final Clinical Impression(s) / ED Diagnoses Final diagnoses:  Suicidal behavior with attempted self-injury [facility])      This chart was dictated using voice recognition software.  Despite best efforts to proofread,  errors can occur which  can change the documentation meaning.   Fatima Blank, MD 07/29/18 [phone]

## 2018-07-29 NOTE — ED Notes (Addendum)
Pt offered and recieved snack.

## 2018-07-29 NOTE — [facility] Counselor (Signed)
CSW attempted to do PSA with pt. Pt was laying in bed and stated that he is in pain right now and could not do the assessment right now.

## 2018-07-29 NOTE — ED Notes (Signed)
Attempted to discharge pt, he stated "I'm not leaving you'll have to drag me out".  Security and [employer] at bedside.

## 2018-07-29 NOTE — ED Notes (Signed)
Pt made aware of pending transport to [facility]. Pt declines to contact family to inform of transport.

## 2018-07-29 NOTE — Consult Note (Signed)
Telepsych Consultation   Reason for Consult:  Attempted suicide, depression Referring Physician:  EDP  Location of Patient: [facility] [phone] Location of Provider: [facility]  Patient Identification: Randall [name] MRN:  [id number] Principal Diagnosis: <principal problem not specified> Diagnosis:  Active Problems:   * No active hospital problems. *   Total Time spent with patient: 20 minutes  Subjective:   Randall [name] is a 53 y.o. male patient admitted stating, " I tried to hang myself because I am tried."  HPI: This is a  53 y.o. male requiring psychiatric consultation following  an attempted suicide. Per review of chart, patient was discharged from the ED on 07/29/2018 after being evaluated by behavioral health for suicidal and homicidal thoughts . Following his discharge, per review of chart, patient was found with a cord around his neck and unresponsive. He was taken back to the emergency department. During this assessment, patient acknowledged that at the time he was found, he intentionally tried to commit suicide. He endorses he is still feeling suicidal although he denies homicidal thoughts. He states if he is discharged, he will attempt suicide again. He identifies current stressor as homelessness. He has a history of polysubstance abuse that includes amphetamines although his UDS was negative on 07/28/2018. He reports he has not used nay substances int he past week. Per his report, he has had multiple suicide attempts in the past along with multiple ED visits and psychiatric hospitalizations. He endorses that he has no current outpatient psychiatric services. His psychiatric history is further remarkable for depression and paranoia/hallucinations. Her denies any psychotic symptoms at this time. He endorses he has no support system.      Past Psychiatric History: Depression, polysubstance abuse, suicide attempts, paranoia/hallucinations.   Risk to  Self: Suicidal Ideation: Yes-Currently Present Suicidal Intent: Yes-Currently Present Is patient at risk for suicide?: Yes Suicidal Plan?: Yes-Currently Present Specify Current Suicidal Plan: Attempted to hang self w/ plastic bag Access to Means: Yes Specify Access to Suicidal Means: Plastic bag What has been your use of drugs/alcohol within the last 12 months?: Amphetamines How many times?: (Multiple) Other Self Harm Risks: N/A Triggers for Past Attempts: Unpredictable Intentional Self Injurious Behavior: None Risk to Others: Homicidal Ideation: No-Not Currently/Within Last 6 Months Thoughts of Harm to Others: No-Not Currently Present/Within Last 6 Months Comment - Thoughts of Harm to Others: Some thoughts occasionally of harming others. Current Homicidal Intent: No Current Homicidal Plan: No Access to Homicidal Means: No Identified Victim: No one History of harm to others?: No Assessment of Violence: None Noted Violent Behavior Description: None reported Does patient have access to weapons?: No Criminal Charges Pending?: Yes Describe Pending Criminal Charges: Two trespassing charges Does patient have a court date: Yes Court Date: (Pt does not know) Prior Inpatient Therapy: Prior Inpatient Therapy: Yes Prior Therapy Dates: 2014 Prior Therapy Facilty/Provider(s): [facility]? Reason for Treatment: SI Prior Outpatient Therapy: Prior Outpatient Therapy: No Does patient have an ACCT team?: No Does patient have Intensive In-House Services?  : No Does patient have [facility] services? : No Does patient have P4CC services?: No  Past Medical History:  Past Medical History:  Diagnosis Date  . 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 No pertinent family history. Family Psychiatric  History: None noted in chart.  Social History:  Social History   Substance and Sexual  Activity  Alcohol Use Not Currently     Social History   Substance and Sexual Activity  Drug Use Yes  . Types: Marijuana, Methamphetamines   Comment: patient is evasive in disclosing specifics concerning his drug use    Social History   Socioeconomic History  . Marital status: Divorced    Spouse name: Not on file  . Number of children: Not on file  . Years of education: Not on file  . Highest education level: Not on file  Occupational History  . Not on file  Social Needs  . Financial resource strain: Not on file  . Food insecurity:    Worry: Not on file    Inability: Not on file  . Transportation needs:    Medical: Not on file    Non-medical: Not on file  Tobacco Use  . Smoking status: Current Every Day Smoker    Types: Cigarettes    Start date: 06/30/2011  . Smokeless tobacco: Never Used  Substance and Sexual Activity  . Alcohol use: Not Currently  . Drug use: Yes    Types: Marijuana, Methamphetamines    Comment: patient is evasive in disclosing specifics concerning his drug use  . Sexual activity: Not on file  Lifestyle  . Physical activity:    Days per week: Not on file    Minutes per session: Not on file  . Stress: Not on file  Relationships  . Social connections:    Talks on phone: Not on file    Gets together: Not on file    Attends religious service: Not on file    Active member of club or organization: Not on file    Attends meetings of clubs or organizations: Not on file    Relationship status: Not on file  Other Topics Concern  . Not on file  Social History Narrative   ** Merged History Encounter **       Additional Social History:    Allergies:   Allergies  Allergen Reactions  . Acetaminophen Nausea And Vomiting  . Morphine Nausea And Vomiting  . Morphine And Related Nausea And Vomiting    (Per patient's other chart in Epic- "Randall [name]," MRN [id number])    . Tylenol [Acetaminophen] Nausea And Vomiting    (Per patient's other  chart in Epic- "Randall [name]," MRN [id number])  . Morphine And Related Nausea Only    Labs:  Results for orders placed or performed during the hospital encounter of 07/29/18 (from the past 48 hour(s))  SARS Coronavirus 2 (CEPHEID - Performed in [facility] hospital lab), Hosp Order     Status: None   Collection Time: 07/29/18  5:57 AM  Result Value Ref Range   SARS Coronavirus 2 NEGATIVE NEGATIVE    Comment: (NOTE) If result is NEGATIVE SARS-CoV-2 target nucleic acids are NOT DETECTED. The SARS-CoV-2 RNA is generally detectable in upper and lower  respiratory specimens during the acute phase of infection. The lowest  concentration of SARS-CoV-2 viral copies this assay can detect is 250  copies / mL. A negative result does not preclude SARS-CoV-2 infection  and should not be used as the sole basis for treatment or other  patient management decisions.  A negative result may occur with  improper specimen collection / handling, submission of specimen other  than nasopharyngeal swab, presence of viral mutation(s) within the  areas targeted by this assay, and inadequate number of viral copies  (<250 copies / mL). A negative result must be combined with  clinical  observations, patient history, and epidemiological information. If result is POSITIVE SARS-CoV-2 target nucleic acids are DETECTED. The SARS-CoV-2 RNA is generally detectable in upper and lower  respiratory specimens dur ing the acute phase of infection.  Positive  results are indicative of active infection with SARS-CoV-2.  Clinical  correlation with patient history and other diagnostic information is  necessary to determine patient infection status.  Positive results do  not rule out bacterial infection or co-infection with other viruses. If result is PRESUMPTIVE POSTIVE SARS-CoV-2 nucleic acids MAY BE PRESENT.   A presumptive positive result was obtained on the submitted specimen  and confirmed on repeat testing.  While  2019 novel coronavirus  (SARS-CoV-2) nucleic acids may be present in the submitted sample  additional confirmatory testing may be necessary for epidemiological  and / or clinical management purposes  to differentiate between  SARS-CoV-2 and other Sarbecovirus currently known to infect humans.  If clinically indicated additional testing with an alternate test  methodology [phone]) is advised. The SARS-CoV-2 RNA is generally  detectable in upper and lower respiratory sp ecimens during the acute  phase of infection. The expected result is Negative. Fact Sheet for Patients:  StrictlyIdeas.no Fact Sheet for Healthcare Providers: BankingDealers.co.za This test is not yet approved or cleared by the [place] FDA and has been authorized for detection and/or diagnosis of SARS-CoV-2 by FDA under an Emergency Use Authorization (EUA).  This EUA will remain in effect (meaning this test can be used) for the duration of the COVID-19 declaration under Section 564(b)(1) of the Act, 21 U.S.C. section 360bbb-3(b)(1), unless the authorization is terminated or revoked sooner. Performed at [facility], [place] [address], [place], [place] [address]     Medications:  Current Facility-Administered Medications  Medication Dose Route Frequency Provider Last Rate Last Dose  . OLANZapine (ZYPREXA) tablet 2.5 mg  2.5 mg Oral BID Cardama, Grayce Sessions, MD      . pregabalin (LYRICA) capsule 75 mg  75 mg Oral BID Cardama, Grayce Sessions, MD       No current outpatient medications on file.    Musculoskeletal: Unable to assess. Evalaution completed via telepsych  Psychiatric Specialty Exam: Physical Exam  Nursing note reviewed. Constitutional: He is oriented to person, place, and time.  Neurological: He is alert and oriented to person, place, and time.    Review of Systems  Psychiatric/Behavioral: Positive for depression, substance abuse and suicidal  ideas. Negative for hallucinations and memory loss. The patient has insomnia. The patient is not nervous/anxious.   All other systems reviewed and are negative.   Blood pressure (!) 140/96, pulse 82, temperature 97.8 F (36.6 C), temperature source Oral, resp. rate 18, SpO2 99 %.There is no height or weight on file to calculate BMI.  General Appearance: Fairly Groomed  Eye Contact:  Fair  Speech:  Clear and Coherent and Normal Rate  Volume:  Decreased  Mood:  Depressed, Hopeless and Worthless  Affect:  Congruent  Thought Process:  Coherent, Goal Directed, Linear and Descriptions of Associations: Intact  Orientation:  Full (Time, Place, and Person)  Thought Content:  WDL  Suicidal Thoughts:  yes. patient requiring consultation following an attmpted suicide   Homicidal Thoughts:  No  Memory:  Immediate;   Fair Recent;   Fair  Judgement:  Impaired  Insight:  Fair  Psychomotor Activity:  Normal  Concentration:  Concentration: Fair and Attention Span: Fair  Recall:  [employer] of Knowledge:  Fair  Language:  Fair  Akathisia:  Negative  Handed:  Right  AIMS (if indicated):     Assets:  Communication Skills Resilience  ADL's:  Intact  Cognition:  WNL  Sleep:        Treatment Plan Summary: Daily contact with patient to assess and evaluate symptoms and progress in treatment  Disposition:  There is evidence of imminent risk to self or others at present.   Patient  meets criteria for psychiatric inpatient admission. Bed has been assigned at behavioral health .    ED notified of recommendation.   This service was provided via telemedicine using a 2-way, interactive audio and video technology.  Names of all persons participating in this telemedicine service and their role in this encounter. Name:  Mordecai Maes  Role: FNP-C  Name: Randall [name] Role: Patient    Mordecai Maes, NP 07/29/2018 9:44 AM

## 2018-07-29 NOTE — ED Notes (Addendum)
Pt discharged earlier tonight for suicidal ideation.  Came back looking for social security card.  Pt went outside to bench and layed pt belonging bag and blanket down on bench.  He tied a large clear trash bag around his neck and then to arm of bench and layed down on ground and started shaking legs.  Bag was tied tightly around pt's neck but he had his hand holding onto the bag pulling it away from front of neck.  This RN tore bag to release it.  Red mark noted around neck.  PT lying on ground and will not open eyes or follow commands.  Normal respirations.  PT assisted in sitting up to get on stretcher.  Taken to treatment room.  Note- last visit was listed with a different MRN as pt was initially a DOE on last visit and would not give his name after arriving with a blanket in a wheelchair stating that him and his twins needed to check in for pneumonia because they had been out in the rain.  Patting the blanket and telling "the twins" not to cry and that he would take them to the TV to watch cartoons..  See prior chart that is marked for merge.  Once pt to treatment room he was asking if he was in heaven.

## 2018-07-29 NOTE — ED Notes (Signed)
Pt ate 100% of his breakfast and had an intake of 240 ml of milk.

## 2018-07-29 NOTE — Progress Notes (Signed)
Pt accepted to [facility], Bed 302-1 Mordecai Maes, NP is the accepting provider.  Neita Garnet, MD is the attending provider.  Call report to [phone]  Milly @ [facility] Psych ED notified.   Pt is Voluntary.  Pt may be transported by [facility]  Pt scheduled  to arrive at [facility] as soon as transport can be arranged.  Areatha Keas. Judi Cong, MSW, [place] Disposition Clinical Social Work [phone] (cell) [phone] (office)

## 2018-07-29 NOTE — ED Notes (Signed)
Pt refused d/c vitals.
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
## 2018-07-29 NOTE — ED Notes (Signed)
TTS at bedside. 

## 2018-07-29 NOTE — BHH Suicide Risk Assessment (Signed)
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  World Pharmacologist [facility]). Score between 0-7:  no or low risk or alcohol related problems. Score between 8-15:  moderate risk of alcohol related problems. Score between 16-19:  high risk of alcohol related problems. Score 20 or above:  warrants further diagnostic evaluation for alcohol dependence and treatment.   CLINICAL FACTORS:  53 year old male, homeless, presented to ED x 2 in rapid succession with complaints of pain and presenting disorganized, psychotic. 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 Patient was cleared, but angry/threatening in the context of being discharged from ED.  Represented to ED for suicidal attempt by tying cord around neck. At this time patient is poor historian, presenting with disorganized thought process, loose associations, grandiose statements /delusions. Although speech is rambling, he does not exhibit psychomotor agitation at this time. Of note, he is oriented x 3  Psychiatric Specialty Exam: Physical Exam  ROS  Blood pressure 120/83, pulse 92, temperature 98.6 F (37 C), temperature source Oral, resp. rate 18, height 5\' 7"  (1.702 m), weight 70.3 kg, SpO2 99 %.Body mass index is 24.28 kg/m.  See admit note  MSE   COGNITIVE FEATURES THAT CONTRIBUTE TO RISK:  Closed-mindedness, Loss of executive function and Polarized thinking    SUICIDE RISK:   Moderate:  Frequent suicidal ideation with limited intensity, and duration, some specificity in terms of plans, no associated intent, good self-control, limited dysphoria/symptomatology, some risk factors present, and identifiable protective factors, including available and accessible social support.  PLAN OF CARE: Patient will be admitted to inpatient psychiatric unit for stabilization and safety. Will provide and encourage milieu participation. Provide medication management and maked adjustments as needed.  Will follow daily.    I certify that inpatient services furnished can reasonably be expected to improve the patient's condition.   Jenne Campus, MD 07/29/2018, 2:32 PM

## 2018-07-29 NOTE — BHH Group Notes (Signed)
[place] LCSW Group Therapy Note  Date/Time: 07/29/2018 @ 2:00PM  Type of Therapy/Topic:  Group Therapy:  Feelings about Diagnosis  Participation Level:  Did Not Attend   Mood: Did not attend group   Description of Group:    This group will allow patients to explore their thoughts and feelings about diagnoses they have received. Patients will be guided to explore their level of understanding and acceptance of these diagnoses. Facilitator will encourage patients to process their thoughts and feelings about the reactions of others to their diagnosis, and will guide patients in identifying ways to discuss their diagnosis with significant others in their lives. This group will be process-oriented, with patients participating in exploration of their own experiences as well as giving and receiving support and challenge from other group members.   Therapeutic Goals: 1. Patient will demonstrate understanding of diagnosis as evidence by identifying two or more symptoms of the disorder:  2. Patient will be able to express two feelings regarding the diagnosis 3. Patient will demonstrate ability to communicate their needs through discussion and/or role plays  Summary of Patient Progress:    Pt did not attend group.     Therapeutic Modalities:   Cognitive Behavioral Therapy Brief Therapy Feelings Identification   Ardelle Anton, LCSW

## 2018-07-29 NOTE — BH Assessment (Signed)
Clinician called into pt's room to complete [facility] Assessment via Tele Assessment machine. Clinician introduced herself and the purpose for the assessment. Pt stated clinician looked nice, to which clinician stated "thank you," and attempted to have pt verify his name and his date of birth. Pt then noted clinician's long hair, to which clinician again stated "thank you," and again requested pt verify his name and his date of birth. Pt then stated that long hair was good for grabbing onto and pulling from behind during sex, as it can get hot and sweaty, and pt began to moan. Clinician re-directed pt that if he was going to speak in such a manner that she was going to end the assessment. Clinician again requested pt verify his name and date of birth. Pt continued to moan with his eyes closed. Clinician informed pt that she was going to end the assessment and call back later if he was unwilling to cooperate. Pt continued to keep his eyes closed and moaned at clinician. Pt informed pt that she was ending the assessment and that contact would be made later in an attempt to complete the assessment.
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
## 2018-07-29 NOTE — ED Notes (Signed)
Pt awake and alert in bed eating breakfast.

## 2018-07-30 LAB — LIPID PANEL
Cholesterol: 184 mg/dL (ref 0–200)
HDL: 43 mg/dL (ref >40)
LDL Cholesterol: 112 mg/dL — ABNORMAL HIGH (ref 0–99)
Total CHOL/HDL Ratio: 4.3 RATIO
Triglycerides: 144 mg/dL (ref <150)
VLDL: 29 mg/dL (ref 0–40)

## 2018-07-30 LAB — HEMOGLOBIN A1C
Hgb A1c MFr Bld: 6.1 % — ABNORMAL HIGH (ref 4.8–5.6)
Mean Plasma Glucose: 128.37 mg/dL

## 2018-07-30 LAB — TSH: TSH: 0.604 u[IU]/mL (ref 0.350–4.500)

## 2018-07-30 MED ORDER — OLANZAPINE 5 MG PO TABS
5.0000 mg | ORAL_TABLET | Freq: Two times a day (BID) | ORAL | Status: DC
  Administered 2018-07-30 – 2018-07-31 (×3): 5 mg via ORAL
  Filled 2018-07-30 (×2): qty 1
  Filled 2018-07-30: qty 2
  Filled 2018-07-30 (×3): qty 1

## 2018-07-30 MED ORDER — TRAZODONE HCL 100 MG PO TABS
100.0000 mg | ORAL_TABLET | Freq: Every evening | ORAL | Status: DC | PRN
  Administered 2018-07-30 – 2018-07-31 (×2): 100 mg via ORAL
  Filled 2018-07-30 (×2): qty 1

## 2018-07-30 NOTE — Progress Notes (Signed)
Patient is observed on the unit hyper-vebral, flirtatious and sexually inappropriate with staff, and is not easily re-directable. Patient is not able to participate in assessment at this time.  Stephanie Acre, LCSW-A Clinical Social Worker

## 2018-07-30 NOTE — Progress Notes (Signed)
Patient has been isolative to his room asleep. He woke up and requested snack and medication to aid with sleep. He received snack and trazodone. Writer asked him several questions pertaining to his assessment. He was given gatorade for hydration and returned to his room to rest. Safety maintained with 15 min checks.

## 2018-07-30 NOTE — Progress Notes (Signed)
[facility] MD Progress Note  07/30/2018 9:58 AM Randall [name]  MRN:  [id number] Subjective:  Reports " I am doing great Sir". Does not endorse medication side effects at this time. Denies SI. Objective : I have discussed case with treatment team and have met with patient. 53 year old male, homeless, presented to ED x 2 in rapid succession with complaints of pain and presenting disorganized, psychotic. 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 Patient was cleared, but angry/threatening in the context of being discharged from ED.  Represented to ED for suicidal attempt by tying cord around neck. On admission  patient is poor historian, presenting with disorganized thought process, loose associations, grandiose statements /delusions. Although speech is rambling, he does not exhibit psychomotor agitation at this time. Of note, he is oriented x 3  Patient presents alert, attentive, with improved grooming today. States " I slept OK and I took a shower this morning". He remains intermittently loud and exhibits a disorganized thought process, but some improvement noted compared to initial presentation.  Thought process remains tangential and exhibits paranoid/ grandiose  ideations. States " the government knows what you are going to ask me already, they gave me all this sh...", " I try to love everybody, but my anger is God's anger and it's terrible ", " the next step is to get people off the government masks". He remains pressured at times , but less irritable and less loud . He continues to present as a fair historian, and does not provide significant information regarding events that led to admission. Currently denies drug abuse .  Noted to be visible on unit on phone, little interaction with peers, no agitated behavior or psychomotor agitation noted today.  Cooperative on approach. Denies medication side effects. 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 No pertinent family history. Family Psychiatric  History: Social History:  Social History   Substance and Sexual Activity  Alcohol Use Not Currently     Social History   Substance and Sexual Activity  Drug Use Yes  . Types: Marijuana, Methamphetamines   Comment: patient is evasive in disclosing specifics concerning his drug use    Social History   Socioeconomic History  . Marital status: Divorced    Spouse name: Not on file  . Number of children: Not on file  . Years of education: Not on file  . Highest education level: Not on file  Occupational History  . Not on file  Social Needs  . Financial resource strain: Not on file  . Food insecurity:    Worry: Not on file    Inability: Not on file  . Transportation needs:    Medical: Not on file    Non-medical: Not on file  Tobacco Use  . Smoking status: Current Every Day Smoker    Types: Cigarettes    Start date: 06/30/2011  . Smokeless tobacco: Never Used  Substance and Sexual Activity  . Alcohol use: Not Currently  . Drug use: Yes  Types: Marijuana, Methamphetamines    Comment: patient is evasive in disclosing specifics concerning his drug use  . Sexual activity: Not Currently  Lifestyle  . Physical activity:    Days per week: Not on file    Minutes per session: Not on file  . Stress: Not on file  Relationships  . Social connections:    Talks on phone: Not on file    Gets together: Not on file    Attends religious service: Not  on file    Active member of club or organization: Not on file    Attends meetings of clubs or organizations: Not on file    Relationship status: Not on file  Other Topics Concern  . Not on file  Social History Narrative   ** Merged History Encounter **       Additional Social History:   Sleep: fair but improving   Appetite:  Good  Current Medications: Current Facility-Administered Medications  Medication Dose Route Frequency Provider Last Rate Last Dose  . alum & mag hydroxide-simeth (MAALOX/MYLANTA) 200-200-20 MG/5ML suspension 30 mL  30 mL Oral Q4H PRN Mordecai Maes, NP      . DULoxetine (CYMBALTA) DR capsule 20 mg  20 mg Oral Daily Demonte Dobratz, Myer Peer, MD   20 mg at 07/30/18 4098  . hydrOXYzine (ATARAX/VISTARIL) tablet 25 mg  25 mg Oral Q6H PRN Latunya Kissick, Myer Peer, MD      . LORazepam (ATIVAN) tablet 1 mg  1 mg Oral Q6H PRN Muaz Shorey, Myer Peer, MD      . OLANZapine zydis (ZYPREXA) disintegrating tablet 5 mg  5 mg Oral Q8H PRN Mileidy Atkin, Myer Peer, MD       And  . LORazepam (ATIVAN) tablet 1 mg  1 mg Oral PRN Mekala Winger, Myer Peer, MD       And  . ziprasidone (GEODON) injection 10 mg  10 mg Intramuscular PRN Theone Bowell, Myer Peer, MD      . multivitamin with minerals tablet 1 tablet  1 tablet Oral Daily Benji Poynter, Myer Peer, MD   1 tablet at 07/30/18 [id number]  . OLANZapine (ZYPREXA) tablet 5 mg  5 mg Oral BID Lin Glazier, Myer Peer, MD      . thiamine (B-1) injection 100 mg  100 mg Intramuscular Once Ascher Schroepfer, Myer Peer, MD      . thiamine (VITAMIN B-1) tablet 100 mg  100 mg Oral Daily Hyatt Capobianco, Myer Peer, MD   100 mg at 07/30/18 4782    Lab Results:  Results for orders placed or performed during the hospital encounter of 07/29/18 (from the past 48 hour(s))  Hemoglobin A1c     Status: Abnormal   Collection Time: 07/30/18  6:32 AM  Result Value Ref Range   Hgb A1c MFr Bld 6.1 (H) 4.8 - 5.6 %    Comment: (NOTE) Pre diabetes:          5.7%-6.4% Diabetes:              >6.4% Glycemic control for    <7.0% adults with diabetes    Mean Plasma Glucose 128.37 mg/dL    Comment: Performed at [facility], [place] [address], [place], [place] [address]  Lipid panel     Status: Abnormal   Collection Time: 07/30/18  6:32 AM  Result Value Ref Range   Cholesterol 184 0 - 200 mg/dL   Triglycerides 144 <150 mg/dL   HDL 43 >40 mg/dL   Total CHOL/HDL Ratio 4.3 RATIO   VLDL 29 0 -  40 mg/dL   LDL Cholesterol 112 (H) 0 - 99 mg/dL    Comment:        Total Cholesterol/HDL:CHD Risk Coronary Heart Disease Risk Table                     Men   Women  1/2 Average Risk   3.4   3.3  Average Risk       5.0   4.4  2 X Average Risk   9.6   7.1  3 X Average Risk  23.4   11.0        Use the calculated Patient Ratio above and the CHD Risk Table to determine the patient's CHD Risk.        ATP III CLASSIFICATION (LDL):  <100     mg/dL   Optimal  100-129  mg/dL   Near or Above                    Optimal  130-159  mg/dL   Borderline  160-189  mg/dL   High  >190     mg/dL   Very High Performed at [facility] [address] [place], [place] [address]   TSH     Status: None   Collection Time: 07/30/18  6:32 AM  Result Value Ref Range   TSH 0.604 0.350 - 4.500 uIU/mL    Comment: Performed by a 3rd Generation assay with a functional sensitivity of <=0.01 uIU/mL. Performed at [facility], [place] [address] [place], [place] [address]     Blood Alcohol level:  Lab Results  Component Value Date   [facility] <10 07/28/2018   ETH <10 79/39/0300    Metabolic Disorder Labs: Lab Results  Component Value Date   HGBA1C 6.1 (H) 07/30/2018   MPG 128.37 07/30/2018   No results found for: PROLACTIN Lab Results  Component Value Date   CHOL 184 07/30/2018   TRIG 144 07/30/2018   HDL 43 07/30/2018   CHOLHDL 4.3 07/30/2018   VLDL 29 07/30/2018   LDLCALC 112 (H) 07/30/2018    Physical Findings: AIMS:  , ,  ,  ,    CIWA:  CIWA-Ar Total: 0 COWS:      Musculoskeletal: Strength & Muscle Tone: within normal limits Gait & Station: normal Patient leans: N/A  Psychiatric Specialty Exam: Physical Exam  ROS no chest pain, no shortness of breath, no cough, no fever or chills   Blood pressure (!) 110/97, pulse 92, temperature 97.7 F (36.5 C), temperature source Oral, resp. rate 16, height '5\' 7"'  (1.702 m), weight 70.3 kg, SpO2 99 %.Body mass index is 24.28 kg/m.  General Appearance: noticeably improved grooming   Eye Contact:  Good  Speech:  remains pressured and rambling at times, less loud  Volume:  Normal  Mood:  describes mood as " great"  Affect:  remains expansive in affect, less irritable  Thought Process:  Disorganized and Descriptions of Associations: Tangential  Orientation:  Other:  fully alert and attentive  Thought Content:  denies hallucinations, paranoid and grandiose ideations as above   Suicidal Thoughts:  No  Homicidal Thoughts:  No ( makes vague statements reporting that his anger "is the anger of God and it kills", but denies any homicidal ideations and denies any violent or homicidal ideations towards anyone  Memory:  recent and remote fair   Judgement:  Impaired  Insight:  Lacking  Psychomotor Activity:  Normal- not psycho motorically  agitated at this time  Concentration:  Concentration: Fair and Attention Span: Fair  Recall:  [employer] of Knowledge:  Fair  Language:  Fair  Akathisia:  Negative  Handed:  Right  AIMS (if indicated):     Assets:  Desire for Improvement Resilience  ADL's:  Improving   Cognition:  WNL  Sleep:      Assessment  53 year old male, homeless, presented to ED x 2 in rapid succession with complaints of pain and presenting disorganized, psychotic. 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 Patient was cleared, but angry/threatening in the context of being discharged from ED.  Represented to ED for suicidal attempt by tying  cord around neck. On admission  patient is poor historian, presenting with disorganized thought process, loose associations, grandiose statements /delusions. Although speech is rambling, he does not exhibit psychomotor agitation at this time. Of note, he is oriented x 3  Today patient continues to present with disorganized thought process, tangentiality, poorly formed paranoid and grandiose ideations. Remains poor historian and cannot provide coherent explanation regarding events that led to admission. Overall presents improved compared to initial presentation, however- better groomed, less irritable, redirectable . Denies medication side effects.   Treatment Plan Summary: Daily contact with patient to assess and evaluate symptoms and progress in treatment, Medication management, Plan inpatient treatment and medications as below Encourage group and milieu participation Encourage efforts to work on sobriety, abstinence  Increase Zyprexa to 5 mgrs BID for psychosis, mood disorder  Will D/C Cymbalta for now, as patient generally presenting with mood elevation rather than with depression today Continue Ativan PRN for WDL symptoms if needed  Agitation protocol for acute agitation as needed ( Ativan/Zyprexa PO / Geodon IM)   Jenne Campus, MD 07/30/2018, 9:58 AM

## 2018-07-30 NOTE — Tx Team (Signed)
Interdisciplinary Treatment and Diagnostic Plan Update  07/30/2018 Time of Session: 9:00am Randall [name] MRN: [id number]  Principal Diagnosis: <principal problem not specified>  Secondary Diagnoses: Active Problems:   MDD (major depressive disorder)   Current Medications:  Current Facility-Administered Medications  Medication Dose Route Frequency Provider Last Rate Last Dose  . alum & mag hydroxide-simeth (MAALOX/MYLANTA) 200-200-20 MG/5ML suspension 30 mL  30 mL Oral Q4H PRN Mordecai Maes, NP      . DULoxetine (CYMBALTA) DR capsule 20 mg  20 mg Oral Daily Cobos, Myer Peer, MD   20 mg at 07/30/18 3546  . hydrOXYzine (ATARAX/VISTARIL) tablet 25 mg  25 mg Oral Q6H PRN Cobos, Myer Peer, MD      . LORazepam (ATIVAN) tablet 1 mg  1 mg Oral Q6H PRN Cobos, Myer Peer, MD      . OLANZapine zydis (ZYPREXA) disintegrating tablet 5 mg  5 mg Oral Q8H PRN Cobos, Myer Peer, MD       And  . LORazepam (ATIVAN) tablet 1 mg  1 mg Oral PRN Cobos, Myer Peer, MD       And  . ziprasidone (GEODON) injection 10 mg  10 mg Intramuscular PRN Cobos, Myer Peer, MD      . multivitamin with minerals tablet 1 tablet  1 tablet Oral Daily Cobos, Myer Peer, MD   1 tablet at 07/30/18 [phone]  . OLANZapine (ZYPREXA) tablet 5 mg  5 mg Oral BID Cobos, Fernando A, MD      . thiamine (B-1) injection 100 mg  100 mg Intramuscular Once Cobos, Fernando A, MD      . thiamine (VITAMIN B-1) tablet 100 mg  100 mg Oral Daily Cobos, Myer Peer, MD   100 mg at 07/30/18 2751   PTA Medications: Medications Prior to Admission  Medication Sig Dispense Refill Last Dose  . cyclobenzaprine (FLEXERIL) 10 MG tablet Take 1 tablet (10 mg total) by mouth 3 (three) times daily as needed for muscle spasms. (Patient not taking: Reported on 07/27/2018) 90 tablet 1 Not Taking at Unknown time  . diclofenac sodium (VOLTAREN) 1 % GEL Apply 2 g topically 4 (four) times daily. (Patient not taking: Reported on 07/27/2018) 3 Tube 1 Not Taking at  Unknown time  . OLANZapine (ZYPREXA) 5 MG tablet Take 0.5 tablets (2.5 mg total) by mouth 2 (two) times daily. (Patient not taking: Reported on 07/27/2018) 60 tablet 0 Not Taking at Unknown time  . pregabalin (LYRICA) 75 MG capsule Take 1 capsule (75 mg total) by mouth 2 (two) times daily. (Patient not taking: Reported on 07/27/2018) 60 capsule 1 Not Taking at Unknown time    Patient Stressors: Financial difficulties Health problems Marital or family conflict Medication change or noncompliance  Patient Strengths: Capable of independent living Communication skills  Treatment Modalities: Medication Management, Group therapy, Case management,  1 to 1 session with clinician, Psychoeducation, Recreational therapy.   Physician Treatment Plan for Primary Diagnosis: <principal problem not specified> Long Term Goal(s): Improvement in symptoms so as ready for discharge Improvement in symptoms so as ready for discharge   Short Term Goals: Ability to identify changes in lifestyle to reduce recurrence of condition will improve Ability to verbalize feelings will improve Ability to disclose and discuss suicidal ideas Ability to demonstrate self-control will improve Ability to identify and develop effective coping behaviors will improve Ability to maintain clinical measurements within normal limits will improve Ability to identify changes in lifestyle to reduce recurrence of condition will improve Ability to verbalize  feelings will improve Ability to disclose and discuss suicidal ideas Ability to demonstrate self-control will improve Ability to identify and develop effective coping behaviors will improve Ability to maintain clinical measurements within normal limits will improve  Medication Management: Evaluate patient's response, side effects, and tolerance of medication regimen.  Therapeutic Interventions: 1 to 1 sessions, Unit Group sessions and Medication administration.  Evaluation of  Outcomes: Not Met  Physician Treatment Plan for Secondary Diagnosis: Active Problems:   MDD (major depressive disorder)  Long Term Goal(s): Improvement in symptoms so as ready for discharge Improvement in symptoms so as ready for discharge   Short Term Goals: Ability to identify changes in lifestyle to reduce recurrence of condition will improve Ability to verbalize feelings will improve Ability to disclose and discuss suicidal ideas Ability to demonstrate self-control will improve Ability to identify and develop effective coping behaviors will improve Ability to maintain clinical measurements within normal limits will improve Ability to identify changes in lifestyle to reduce recurrence of condition will improve Ability to verbalize feelings will improve Ability to disclose and discuss suicidal ideas Ability to demonstrate self-control will improve Ability to identify and develop effective coping behaviors will improve Ability to maintain clinical measurements within normal limits will improve     Medication Management: Evaluate patient's response, side effects, and tolerance of medication regimen.  Therapeutic Interventions: 1 to 1 sessions, Unit Group sessions and Medication administration.  Evaluation of Outcomes: Not Met   RN Treatment Plan for Primary Diagnosis: <principal problem not specified> Long Term Goal(s): Knowledge of disease and therapeutic regimen to maintain health will improve  Short Term Goals: Ability to demonstrate self-control, Ability to identify and develop effective coping behaviors will improve and Compliance with prescribed medications will improve  Medication Management: RN will administer medications as ordered by provider, will assess and evaluate patient's response and provide education to patient for prescribed medication. RN will report any adverse and/or side effects to prescribing provider.  Therapeutic Interventions: 1 on 1 counseling sessions,  Psychoeducation, Medication administration, Evaluate responses to treatment, Monitor vital signs and CBGs as ordered, Perform/monitor CIWA, COWS, AIMS and Fall Risk screenings as ordered, Perform wound care treatments as ordered.  Evaluation of Outcomes: Not Met   LCSW Treatment Plan for Primary Diagnosis: <principal problem not specified> Long Term Goal(s): Safe transition to appropriate next level of care at discharge, Engage patient in therapeutic group addressing interpersonal concerns.  Short Term Goals: Engage patient in aftercare planning with referrals and resources, Increase ability to appropriately verbalize feelings and Increase emotional regulation  Therapeutic Interventions: Assess for all discharge needs, 1 to 1 time with Social worker, Explore available resources and support systems, Assess for adequacy in community support network, Educate family and significant other(s) on suicide prevention, Complete Psychosocial Assessment, Interpersonal group therapy.  Evaluation of Outcomes: Not Met   Progress in Treatment: Attending groups: No. Participating in groups: No. Taking medication as prescribed: Yes. Toleration medication: Yes. Family/Significant other contact made: No, will contact:  supports if consents are granted Patient understands diagnosis: Yes. Discussing patient identified problems/goals with staff: Yes. Medical problems stabilized or resolved: No. Denies suicidal/homicidal ideation: Yes. Issues/concerns per patient self-inventory: Yes.  New problem(s) identified: No, Describe:  CSW continuing to assess  New Short Term/Long Term Goal(s): detox, medication management for mood stabilization; elimination of SI thoughts; development of comprehensive mental wellness/sobriety plan.  Patient Goals:  "talk about my woes."  Discharge Plan or Barriers: CSW continuing to assess for appropriate referrals.   Reason for  Continuation of Hospitalization:  Anxiety Mania Medication stabilization Withdrawal symptoms  Estimated Length of Stay: 3-5 days  Attendees: Patient: Randall [name] 07/30/2018 9:34 AM  Physician:  07/30/2018 9:34 AM  Nursing:  07/30/2018 9:34 AM  RN Care Manager: 07/30/2018 9:34 AM  Social Worker: Stephanie Acre, [place] 07/30/2018 9:34 AM  Recreational Therapist:  07/30/2018 9:34 AM  Other:  07/30/2018 9:34 AM  Other:  07/30/2018 9:34 AM  Other: 07/30/2018 9:34 AM    Scribe for Treatment Team: Joellen Jersey, [place] 07/30/2018 9:34 AM

## 2018-07-30 NOTE — Progress Notes (Signed)
LCSW Group Therapy Notes 07/30/2018 3:03 PM  Type of Therapy and Topic: Group Therapy: Overcoming Obstacles  Participation Level: Active   Description of Group:  In this group patients will be encouraged to explore what they see as obstacles to their own wellness and recovery. They will be guided to discuss their thoughts, feelings, and behaviors related to these obstacles. The group will process together ways to cope with barriers, with attention given to specific choices patients can make. Each patient will be challenged to identify changes they are motivated to make in order to overcome their obstacles. This group will be process-oriented, with patients participating in exploration of their own experiences as well as giving and receiving support and challenge from other group members.  Therapeutic Goals: 1. Patient will identify personal and current obstacles as they relate to admission. 2. Patient will identify barriers that currently interfere with their wellness or overcoming obstacles.  3. Patient will identify feelings, thought process and behaviors related to these barriers. 4. Patient will identify two changes they are willing to make to overcome these obstacles:   Summary of Patient Progress Patient was intrusive and monopolizing throughout group. He spoke tangentially and off topic, he did not respond to most redirection. He made bizarre statements about being filmed for a movie.   Therapeutic Modalities:  Cognitive Behavioral Therapy Solution Focused Therapy Motivational Interviewing Relapse Prevention Therapy  Stephanie Acre, MSW, [facility] 07/30/2018 3:03 PM

## 2018-07-30 NOTE — Progress Notes (Signed)
DAR NOTE: Patient presents with anxious affect and depressed mood.  Denies pain, auditory and visual hallucinations.  Described energy level as high and concentration as poor.  report withdrawal symptoms of sedation, chilling, cravings and runny nose on self inventory form.  Patient is disorganized, delusional and is sexually inappropriate with male staff on the unit.  Needed a lot of redirections on the unit.  Rates depression at 5, hopelessness at 5, and anxiety at 0.  Maintained on routine safety checks.  Medications given as prescribed.  Support and encouragement offered as needed.  Attended group and participated.  States goal for today is "my mind and thinking."  Patient observed socializing with peers in the dayroom.  Ativan 1 mg given for agitation with good effect.  Patient is safe on the unit.

## 2018-07-30 NOTE — Progress Notes (Signed)
Pt denies SI/HI, hallucinations, and pain.  Describes his day as "great."  Pt has been in bed for the majority of the evening.  He was compliant with HS medication.  Pt verbally contracts for safety and reports he will inform staff of needs and concerns.  Will continue to monitor and assess.

## 2018-07-31 MED ORDER — OLANZAPINE 5 MG PO TABS
5.0000 mg | ORAL_TABLET | ORAL | Status: DC
  Administered 2018-08-01 – 2018-08-02 (×2): 5 mg via ORAL
  Filled 2018-07-31 (×3): qty 1

## 2018-07-31 MED ORDER — OLANZAPINE 10 MG PO TABS
10.0000 mg | ORAL_TABLET | Freq: Every day | ORAL | Status: DC
  Administered 2018-07-31: 21:00:00 10 mg via ORAL
  Filled 2018-07-31 (×3): qty 1

## 2018-07-31 MED ORDER — TRAZODONE HCL 100 MG PO TABS
100.0000 mg | ORAL_TABLET | Freq: Every evening | ORAL | Status: DC | PRN
  Administered 2018-07-31: 23:00:00 100 mg via ORAL
  Filled 2018-07-31 (×13): qty 1

## 2018-07-31 NOTE — Progress Notes (Signed)
Patient ID: Field Staniszewski, male   DOB: 1965/12/09, 53 y.o.   MRN: [id number]  Nursing Progress Note [id number]  On initial approach, patient is seen up in the milieu. Patient presents labile with flight of ideas, disorganized thinking and rapid, pressured speech. Patient appears sexually preoccupied and states to writer, "I just want to jump through the window and kiss you!" Patient was compliant with medications but did appear ambivalent about taking them. During group, MHT reports patient began to monopolize the conversation and attempted to dictate the group. Patient made statements about wanting to "kill" his peers and began to posture at MHT. Patient was able to be redirected to his room. Patient moved to 500 hall for appropriate programing and walked over to the unit in no distress. MD notified. Patient's mood remains very labile. Patient currently denies SI/HI/AVH.   Patient is educated about and provided medication per provider's orders. Patient safety maintained with q15 min safety checks and high fall risk precautions. Emotional support given, 1:1 interaction, and active listening provided. Patient encouraged to attend meals, groups, and work on treatment plan and goals. Labs, vital signs and patient behavior monitored throughout shift.   Patient is safe on the unit at this time. Will continue to support and monitor.

## 2018-07-31 NOTE — Progress Notes (Signed)
Randall [name]  07/31/2018 10:04 AM Randall [name]  MRN:  [id number] Subjective:  Patient reports " I am OK". Denies medication side effects. Objective : I have reviewed with RN, reviewed chart notes, and have met with patient. 53 year old male, homeless, presented to ED x 2 in rapid succession with complaints of pain and presenting disorganized, psychotic. 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 Patient was cleared, but angry/threatening in the context of being discharged from ED.  Represented to ED for suicidal attempt by tying cord around neck. On admission  patient is poor historian, presenting with disorganized thought process, loose associations, grandiose statements /delusions. Although speech is rambling, he does not exhibit psychomotor agitation at this time. Of [name], he is oriented x 3  Patient presents superficially pleasant , jovial, although vaguely irritable. No psychomotor agitation noted at this time. He remains disorganized, with tangential thought process and grandiose ideations . Makes statements such as " we need to make [name] stadium for high power sports, you know , people have to pass the torch", " I need to ask myself what do I need to do for the government". He does not present internally preoccupied and currently denies hallucinations. Staff reports patient has been inappropriate, flirtatious with females, but generally redirectable. Denies suicidal ideations. Denies medication side effects - on Zyprexa .  Remains [name] limited historian and does not provide significant information regarding events leading to admission. Today does endorse " using all sorts of drugs", before admission. ( 5/25 UDS was positive for amphetamines and cannabis, on 5/27 was negative).     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 CML (chronic myelocytic leukemia) ([place]) 2008    Past Surgical History:  Procedure Laterality Date  . 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 No pertinent family history. Family Psychiatric  History: Social History:  Social History   Substance and Sexual Activity  Alcohol Use Not Currently     Social History   Substance and Sexual Activity  Drug Use Yes  . Types: Marijuana, Methamphetamines   Comment: patient is evasive in disclosing specifics concerning his drug use    Social History   Socioeconomic History  . Marital status: Divorced    Spouse name: Not on file  . Number of children: Not on file  . Years of education: Not on file  . Highest education level: Not on file  Occupational History  . Not on file  Social Needs  . Financial resource strain: Not on file  . Food insecurity:    Worry: Not on file    Inability: Not on file  . Transportation needs:    Medical: Not on file    Non-medical: Not on file  Tobacco Use  . Smoking status: Current Every Day Smoker    Types: Cigarettes    Start date: 06/30/2011  . Smokeless tobacco: Never Used  Substance and Sexual Activity  . Alcohol use: Not Currently  . Drug use: Yes    Types: Marijuana, Methamphetamines    Comment: patient is evasive in disclosing specifics concerning his drug use  . Sexual activity: Not Currently  Lifestyle  . Physical activity:    Days per week: Not on file  Minutes per session: Not on file  . Stress: Not on file  Relationships  . Social connections:    Talks on phone: Not on file    Gets together: Not on file    Attends religious service: Not on file    Active member of club or organization: Not on file    Attends meetings of clubs or organizations: Not on file    Relationship status: Not on file  Other Topics  Concern  . Not on file  Social History Narrative   ** Merged History Encounter **       Additional Social History:   Sleep: improving  Appetite:  Good  Current Medications: Current Facility-Administered Medications  Medication Dose Route Frequency Provider Last Rate Last Dose  . alum & mag hydroxide-simeth (MAALOX/MYLANTA) 200-200-20 MG/5ML suspension 30 mL  30 mL Oral Q4H PRN Randall Maes, NP      . hydrOXYzine (ATARAX/VISTARIL) tablet 25 mg  25 mg Oral Q6H PRN Randall [name], Randall Peer, MD      . LORazepam (ATIVAN) tablet 1 mg  1 mg Oral Q6H PRN Randall [name], Randall Peer, MD   1 mg at 07/30/18 1100  . OLANZapine zydis (ZYPREXA) disintegrating tablet 5 mg  5 mg Oral Q8H PRN Randall [name], Randall Peer, MD       And  . LORazepam (ATIVAN) tablet 1 mg  1 mg Oral PRN Randall [name], Randall Peer, MD       And  . ziprasidone (GEODON) injection 10 mg  10 mg Intramuscular PRN Randall [name], Randall Peer, MD      . multivitamin with minerals tablet 1 tablet  1 tablet Oral Daily Randall [name], Randall Peer, MD   1 tablet at 07/31/18 0830  . OLANZapine (ZYPREXA) tablet 10 mg  10 mg Oral QHS Randall [name], Randall Peer, MD      . Derrill Memo ON 08/01/2018] OLANZapine (ZYPREXA) tablet 5 mg  5 mg Oral BH-q7a Randall Dyckman A, MD      . thiamine (B-1) injection 100 mg  100 mg Intramuscular Once Randall Machamer A, MD      . thiamine (VITAMIN B-1) tablet 100 mg  100 mg Oral Daily Randall [name], Randall Peer, MD   100 mg at 07/31/18 0830  . traZODone (DESYREL) tablet 100 mg  100 mg Oral QHS PRN Randall Hobby, Randall [name]   100 mg at 07/30/18 2243    Lab Results:  Results for orders placed or performed during the hospital encounter of 07/29/18 (from the past 48 hour(s))  Hemoglobin A1c     Status: Abnormal   Collection Time: 07/30/18  6:32 AM  Result Value Ref Range   Hgb A1c MFr Bld 6.1 (H) 4.8 - 5.6 %    Comment: ([name]) Pre diabetes:          5.7%-6.4% Diabetes:              >6.4% Glycemic control for   <7.0% adults with diabetes    Mean Plasma Glucose 128.37 mg/dL     Comment: Performed at [facility], [place] [address], [place], [place] [address]  Lipid panel     Status: Abnormal   Collection Time: 07/30/18  6:32 AM  Result Value Ref Range   Cholesterol 184 0 - 200 mg/dL   Triglycerides 144 <150 mg/dL   HDL 43 >40 mg/dL   Total CHOL/HDL Ratio 4.3 RATIO   VLDL 29 0 - 40 mg/dL   LDL Cholesterol 112 (H) 0 - 99 mg/dL    Comment:  Total Cholesterol/HDL:CHD Risk Coronary Heart Disease Risk Table                     Men   Women  1/2 Average Risk   3.4   3.3  Average Risk       5.0   4.4  2 X Average Risk   9.6   7.1  3 X Average Risk  23.4   11.0        Use the calculated Patient Ratio above and the CHD Risk Table to determine the patient's CHD Risk.        ATP III CLASSIFICATION (LDL):  <100     mg/dL   Optimal  100-129  mg/dL   Near or Above                    Optimal  130-159  mg/dL   Borderline  160-189  mg/dL   High  >190     mg/dL   Very High Performed at [facility] [address], [place], [place] [address]   TSH     Status: None   Collection Time: 07/30/18  6:32 AM  Result Value Ref Range   TSH 0.604 0.350 - 4.500 uIU/mL    Comment: Performed by [name] 3rd Generation assay with [name] functional sensitivity of <=0.01 uIU/mL. Performed at [facility], [place] [address] [place], [place] [address]     Blood Alcohol level:  Lab Results  Component Value Date   [facility] <10 07/28/2018   ETH <10 09/81/1914    Metabolic Disorder Labs: Lab Results  Component Value Date   HGBA1C 6.1 (H) 07/30/2018   MPG 128.37 07/30/2018   No results found for: PROLACTIN Lab Results  Component Value Date   CHOL 184 07/30/2018   TRIG 144 07/30/2018   HDL 43 07/30/2018   CHOLHDL 4.3 07/30/2018   VLDL 29 07/30/2018   LDLCALC 112 (H) 07/30/2018    Physical Findings: AIMS:  , ,  ,  ,    CIWA:  CIWA-Ar Total: 0 COWS:     Musculoskeletal: Strength & Muscle Tone: within normal limits Gait & Station:  normal Patient leans: N/[name]  Psychiatric Specialty Exam: Physical Exam  ROS no chest pain, no shortness of breath, no cough, no fever or chills   Blood pressure (!) 129/95, pulse 96, temperature 98 F (36.7 C), temperature source Oral, resp. rate 18, height '5\' 7"'  (1.702 m), weight 70.3 kg, SpO2 99 %.Body mass index is 24.28 kg/m.  General Appearance: improved grooming compared to admission  Eye Contact:  Good  Speech:  less pressured   Volume:  Normal  Mood:  states mood is "OK"  Affect:  remains expansive in affect   Thought Process:  Disorganized and Descriptions of Associations: Tangential  Orientation:  Other:  fully alert and attentive  Thought Content:  denies hallucinations, paranoid and grandiose ideations as above   Suicidal Thoughts:  No currently denies suicidal or self injurious ideations and denies homicidal ideations   Homicidal Thoughts:  No  Memory:  recent and remote fair   Judgement:  Impaired  Insight:  Lacking  Psychomotor Activity:  Normal- not psycho motorically agitated at this time  Concentration:  Concentration: Fair and Attention Span: Fair  Recall:  [employer] of Knowledge:  Fair  Language:  Fair  Akathisia:  Negative  Handed:  Right  AIMS (if indicated):     Assets:  Desire for  Improvement Resilience  ADL's:  Improving   Cognition:  WNL  Sleep:  Number of Hours: 5.75   Assessment  53 year old male, homeless, presented to ED x 2 in rapid succession with complaints of pain and presenting disorganized, psychotic. 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 Patient was cleared, but angry/threatening in the context of being discharged from ED.  Represented to ED for suicidal attempt by tying cord around neck. On admission  patient is poor historian, presenting with disorganized thought process, loose associations, grandiose statements /delusions. Although speech is rambling, he does not exhibit  psychomotor agitation at this time. Of [name], he is oriented x 3  Patient presents with some improvement compared to admission, but remains tangential, disorganized, and presenting with grandiose/disorganized ideations. Grooming has improved . Report from staff is that he can be intrusive and flirtatious but generally redirectable. Tolerating Zyprexa trial well thus far. Remains poor/limited historian. Does endorse substance abuse prior to admission.  He is not presenting with symptoms of WDL and does not appear to be in any acute distress or discomfort   Treatment Plan Summary: Daily contact with patient to assess and evaluate symptoms and progress in treatment, Medication management, Plan inpatient treatment and medications as below  Treatment Plan reviewed as below today 5/30 Encourage group and milieu participation Encourage efforts to work on sobriety, abstinence  Increase Zyprexa to 5 mgrs QAM and 10 mgrs QHS for psychosis, mood disorder  Continue Ativan PRN for WDL symptoms if needed  Agitation protocol for acute agitation as needed ( Ativan/Zyprexa PO / Geodon IM)  Treatment team working on disposition planning . *Today patient does provide [name] phone number for possible collateral information- Ms. Randall [name]" at [phone], whom he identifies as mother in Sports coach . Will review with CSW to obtain written consent /collateral information  Randall Campus, MD 07/31/2018, 10:04 AM   Patient ID: Levell July, male   DOB: Feb 16, 1966, 53 y.o.   MRN: [id number]

## 2018-07-31 NOTE — B[facility] Counselor (Signed)
Patient was unable to sit for PSA this morning due to disorganization and psychosis.  Randall Pigeon, LCSW

## 2018-07-31 NOTE — [facility] Group Notes (Signed)
LCSW Group Therapy Note  07/31/2018       Type of Therapy and Topic:  Group Therapy: "My Mental Health"  Participation Level:  Active   Description of Group:   In this group, patients were asked four questions in order to generate discussion around the idea of mental illness and/or substance addiction being medical problems: 1. In one sentence describe the current state of your mental health or substance use. 2. How much do you feel similar to or different from others? 3. Do you tend to identify with other people or compare yourself to them?  4. In a word or sentence, share what you desire your mental health/substance use to be.  Discussion was held that led to the conclusion that comparing ourselves to others is not healthy, but identifying with the elements of their issues that are similar to ours is helpful.    Therapeutic Goals: 1. Patients will identify their feelings about their current mental health/substance use problems. 2. Patients will describe how they feel similar to or different from others, and whether they tend to identify with or compare themselves to other people with the same issues. 3. Patients will explore the differences in these concepts and how a change of mindset about mental health/substance use can help with reaching recovery goals. 4. Patients will think about and share what their recovery goals are, in terms of mental health and/or substance use.  Summary of Patient Progress:  The patient participated in 500 hall group after being moved.  He shared that his mental health today is "crazier every day, crazier than others, more sane than others.  It's like Where's Waldo?"  He was tangential and monopolizing, so CSW was moving on to another patient when this patient became upset at "not being allowed to finish."  He attempted multiple times to monopolize group with long statements that consisted mostly of flight of ideas.  At the end of group when asked what he would  like his mental health to be, he started crying, talking about sometimes wanting to die "instead of them being scared of me."  He was grandiose, comparing himself to [employer] and blue stars.  He was focused on one young male patient especially, and needs to be appropriate in his interactions with her, as she was easily influenced and became very emotional talking to him, as he described her being his butterfly.  Therapeutic Modalities:   Processing Motivation Interviewing Psychoeducation  Maretta Los, MSW, LCSW 1:35 PM  .

## 2018-07-31 NOTE — Progress Notes (Signed)
D: Pt stated he was SI Salley Scarlet- contracts for safety. Pt is labile , tangential and has no insight into his Tx. Pt appears to try to be manipulative and med seeking at times. Pt stated he was anxious and agitated earlier this evening and wanted something and 20 seconds later he was seen laughing and joking with a peer. Pt does present with coping issues . 1:1 time spent with pt explaining about his emotions. Pt encouraged to work on controlling his actions and not trying to control his emotions. Pt tries to talk philosophically when he speaks. Pt  Was very disrespectful with Probation officer during initial interaction, but came back to apologize and pt appeared to be better on the unit this evening.    A: Pt was offered support and encouragement. Pt was given scheduled medications. Pt was encourage to attend groups. Q 15 minute checks were done for safety.   R: safety maintained on unit.

## 2018-07-31 NOTE — Progress Notes (Signed)
Patient ID: Randall [name], male   DOB: 1965-12-24, 53 y.o.   MRN: [id number]  [facility] NOVEL CORONAVIRUS (COVID-19) DAILY CHECK-OFF SYMPTOMS - answer yes or no to each - every day NO YES  Have you had a fever in the past 24 hours?  . Fever (Temp > 37.80C / 100F) X   Have you had any of these symptoms in the past 24 hours? . New Cough .  Sore Throat  .  Shortness of Breath .  Difficulty Breathing .  Unexplained Body Aches   X   Have you had any one of these symptoms in the past 24 hours not related to allergies?   . Runny Nose .  Nasal Congestion .  Sneezing   X   If you have had runny nose, nasal congestion, sneezing in the past 24 hours, has it worsened?  X   EXPOSURES - check yes or no X   Have you traveled outside the state in the past 14 days?  X   Have you been in contact with someone with a confirmed diagnosis of COVID-19 or PUI in the past 14 days without wearing appropriate PPE?  X   Have you been living in the same home as a person with confirmed diagnosis of COVID-19 or a PUI (household contact)?    X   Have you been diagnosed with COVID-19?    X              What to do next: Answered NO to all: Answered YES to anything:   Proceed with unit schedule Follow the [facility] Inpatient Flowsheet.

## 2018-08-01 DIAGNOSIS — F329 Major depressive disorder, single episode, unspecified: Secondary | ICD-10-CM

## 2018-08-01 DIAGNOSIS — F29 Unspecified psychosis not due to a substance or known physiological condition: Secondary | ICD-10-CM

## 2018-08-01 MED ORDER — OLANZAPINE 5 MG PO TBDP: 5.0000 mg | ORAL_TABLET | Freq: Three times a day (TID) | ORAL | Status: DC | PRN

## 2018-08-01 MED ORDER — BENZTROPINE MESYLATE 0.5 MG PO TABS
0.5000 mg | ORAL_TABLET | Freq: Two times a day (BID) | ORAL | Status: DC
  Administered 2018-08-02: 0.5 mg via ORAL
  Filled 2018-08-01 (×4): qty 1

## 2018-08-01 MED ORDER — DIPHENHYDRAMINE HCL 50 MG/ML IJ SOLN
50.0000 mg | Freq: Four times a day (QID) | INTRAMUSCULAR | Status: DC | PRN
  Administered 2018-08-01 – 2018-08-03 (×2): 50 mg via INTRAMUSCULAR
  Filled 2018-08-01 (×2): qty 1

## 2018-08-01 MED ORDER — LORAZEPAM 1 MG PO TABS
1.0000 mg | ORAL_TABLET | Freq: Four times a day (QID) | ORAL | Status: DC | PRN
  Administered 2018-08-02: 1 mg via ORAL
  Filled 2018-08-01: qty 1

## 2018-08-01 MED ORDER — ZIPRASIDONE MESYLATE 20 MG IM SOLR
20.0000 mg | INTRAMUSCULAR | Status: AC | PRN
  Administered 2018-08-01: 18:00:00 20 mg via INTRAMUSCULAR
  Filled 2018-08-01: qty 20

## 2018-08-01 MED ORDER — DIPHENHYDRAMINE HCL 25 MG PO CAPS
50.0000 mg | ORAL_CAPSULE | Freq: Three times a day (TID) | ORAL | Status: DC | PRN
  Administered 2018-08-02: 50 mg via ORAL
  Filled 2018-08-01: qty 2

## 2018-08-01 NOTE — Progress Notes (Signed)
D. Pt has been very labile today-  This am pt left SW's group abruptly - visibly agitated- demanding that he was "going to leave". Pt also reportedly had made direct verbal threats to a peer on the unit.  During lunch, pt proceeded to elope past the unit doors, refusing  to return  to  the unit - After much encouragement by staff,  pt  was eventually  redirected and escorted back onto the unit.. A. Labs and vitals monitored. Pt supported emotionally and encouraged to express concerns and ask questions.   R. Pt remains safe with 15 minute checks. Will continue POC.

## 2018-08-01 NOTE — BH Assessment (Signed)
Clinical Social Work Note  CSW contacted patient's sister with written consent.  After doing the Psychosocial Assessment with her due to his ongoing psychosis, CSW informed him that she will be calling him.  She was not aware he was hospitalized.  During this short exchange, patient was labile and at times made threatening statements, saying he will go to the doctor's house and "blow his brains out to feel better" and other such statements.  Selmer Dominion, LCSW 08/01/2018, 1:04 PM

## 2018-08-01 NOTE — [facility] Counselor (Signed)
Adult Comprehensive Assessment  Patient ID: Randall [name], male   DOB: 08-11-1965, 53 y.o.   MRN: [id number]  Information Source: Information source: (Information provided by Sister Wood Novacek [phone], because patient remains too psychotic to assess.  Mother was also present, so at times daughter would ask her questions.)  Current Stressors:  Patient states their primary concerns and needs for treatment are:: Sister's concern is that patient thinks people are trying to kill him with his cancer medications, and therefore is not getting any treatment for his leukemia Patient states their goals for this hospitilization and ongoing recovery are:: Get well and stable Family Relationships: Not good relationships with any family members - only calls them when he needs something Financial / Lack of resources (include bankruptcy): Has disability, very small income, controls it himself. Housing / Lack of housing: Homeless for almost 1 year and often gets kicked out of places due to his mental health issues, substance abuse, and anger. Physical health (include injuries & life threatening diseases): Has cancer (Stage 3 leukemia) currently, has not been taking his medicine.  Has had this since 2008, in remission at one point, got it again in 2018. Substance abuse: Tells sister he is a drug addict.  Was doing heroin "awhile back" and "takes pills because of the pain from the cancer."  He also was doing meth. Bereavement / Loss: Girlfriend and some other friends overdosed on heroin  Living/Environment/Situation:  Living Arrangements: Other (Comment) Living conditions (as described by patient or guardian): Homeless, living with a friend then with another friend, then a shelter and was kicked out of that. Who else lives in the home?: Nobody How long has patient lived in current situation?: Since getting out of jail in June 2019. What is atmosphere in current home: Chaotic, Dangerous  Family  History:  Marital status: Divorced Are you sexually active?: (Unknown) What is your sexual orientation?: Straight Does patient have children?: Yes How many children?: 4 How is patient's relationship with their children?: Oldest - no relationship; 2 youngest - does not see them; 53yo - talks to them "here and there"  Childhood History:  By whom was/is the patient raised?: Both parents Description of patient's relationship with caregiver when they were a child: Not good with either parent Patient's description of current relationship with people who raised him/her: Mother - does not talk to him much because of his life choices; Father - same, because he only calls when he needs help. How were you disciplined when you got in trouble as a child/adolescent?: (Not discussed) Does patient have siblings?: Yes Number of Siblings: 3 Description of patient's current relationship with siblings: 2 sisters, 1 brother - Freida Busman is the closest to him, none are in [place], all in [place] Did patient suffer any verbal/emotional/physical/sexual abuse as a child?: No Did patient suffer from severe childhood neglect?: No Has patient ever been sexually abused/assaulted/raped as an adolescent or adult?: No Was the patient ever a victim of a crime or a disaster?: No Witnessed domestic violence?: No Has patient been effected by domestic violence as an adult?: (Unknown)  Education:  Highest grade of school patient has completed: 11th grade Currently a student?: No Learning disability?: No  Employment/Work Situation:   Employment situation: On disability Why is patient on disability: Leukemia How long has patient been on disability: Off and on since 2008 Where was the patient employed at that time?: [employer] Did You Receive Any Psychiatric Treatment/Services While in the [employer]?: (No [employer])  Are There Guns or Other Weapons in [place]?: No  Financial Resources:   Financial  resources: Receives SSI, [place] Does patient have a representative payee or guardian?: No  Alcohol/Substance Abuse:   What has been your use of drugs/alcohol within the last 12 months?: Unknown by sister.  He has told her he is trying to remain sober.  He has a history with heroin, methampethamines, and "pills" for pain. 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  Has been hospitalized several times. Has alcohol/substance abuse ever caused legal problems?: (Has been in prison with drug-related charges.)  Social Support System:   Patient's Community Support System: Poor Describe Community Support System: Family in [place] Type of faith/religion: Born [place] How does patient's faith help to cope with current illness?: Unknown  Leisure/Recreation:      Strengths/Needs:      Discharge Plan:   Currently receiving community mental health services: No Patient states concerns and preferences for aftercare planning are: This will have to be addressed to patient when he is more stable. Does patient have access to transportation?: No Does patient have financial barriers related to discharge medications?: Yes Patient description of barriers related to discharge medications: Has disability income and Medicaid Plan for no access to transportation at discharge: Bus pass may be needed Will patient be returning to same living situation after discharge?: (Unknown)  Summary/Recommendations:   Summary and Recommendations (to be completed by the evaluator): Patient is a 53yo male admitted with suicide attempt, bag tied tightly around his neck at the ED, and acute psychosis.  During the assessment for admission, he talked about attempting to kill himself 23 times per day and endorsed thoughts of harming others.  He has a history of using heroin, pills, and methamphetamines and has been in treatment several times.  A girlfriend  and multiple friends have died from heroin overdoses.  He has been homeless since being released from prison in June 2019, has lived with different friends and been kicked out of a Publishing rights manager.  The patient has leukemia which had gone into remission but has been back for several years.  He received treatment for it while in prison, but when released, he refused treatment, stating that the doctors were trying to kill him with the medicine.  He has few family supports and this assessment was done with his sister Davieon Stockham and his mother at [phone].  Patient will benefit from crisis stabilization, medication evaluation, group therapy and psychoeducation, in addition to case management for discharge planning. At discharge it is recommended that Patient adhere to the established discharge plan and continue in treatment.  Maretta Los. 08/01/2018

## 2018-08-01 NOTE — [facility] Counselor (Signed)
Clinical Social Work Note  Patient this morning is agreeable to staff contacting his sister Freida Busman at [phone], and signed consent.  CSW tried to call her to obtain collateral, but the phone immediately went to voicemail.  A HIPAA-compliant message was left requesting a call back.  Selmer Dominion, LCSW 08/01/2018, 8:40 AM

## 2018-08-01 NOTE — [facility] Group Notes (Signed)
[facility] LCSW Group Therapy Note  Date/Time:  08/01/2018  11:00AM-12:00PM  Type of Therapy and Topic:  Group Therapy:  Music and Mood  Participation Level:  Minimal   Description of Group: In this process group, members listened to a variety of genres of music and identified that different types of music evoke different responses.  Patients were encouraged to identify music that was soothing for them and music that was energizing for them.  Patients discussed how this knowledge can help with wellness and recovery in various ways including managing depression and anxiety as well as encouraging healthy sleep habits.    Therapeutic Goals: 1. Patients will explore the impact of different varieties of music on mood 2. Patients will verbalize the thoughts they have when listening to different types of music 3. Patients will identify music that is soothing to them as well as music that is energizing to them 4. Patients will discuss how to use this knowledge to assist in maintaining wellness and recovery 5. Patients will explore the use of music as a coping skill  Summary of Patient Progress:  At the beginning of group, patient expressed that he felt "like Biblical water."  He did not stop talking during the first two songs, even though it was quiet.  Another patient told him "this is not the time to talk" and he immediately was angry and left the room while walking close to the patient and glaring at him in a threatening manner.  He stood outside the dayroom at the nursing station for the remainder of group.  When CSW approached him after group to ask how he was, he became angry, so CSW quickly left.  Therapeutic Modalities: Solution Focused Brief Therapy Activity   Selmer Dominion, LCSW

## 2018-08-01 NOTE — Progress Notes (Addendum)
[facility] MD Progress Note  08/01/2018 11:59 AM Randall [name]  MRN:  [id number]   Subjective:  Randall [name] reports " I want to go join my family in heaven, I just don't know anymore"   Objective: Randall [name] observed interacting with staff.  He presents hyperverbal aggressive and irritable throughout this assessment.  Patient is requesting to be discharged as he states he " I checked myself in voluntarily."  Patient is tangential and has loose associations.  He denies suicidal or homicidal ideations.  Denies auditory visual hallucinations.  Patient is labile and has been difficult to redirect.  Denies attending daily group sessions.  He reports taking his medication as prescribed and tolerating them well.  Support, encouragement and reassurance was provided.   Principal Problem: Psychosis, Unspecified- consider Substance Induced  Diagnosis: Active Problems:   MDD (major depressive disorder)  Total Time spent with patient: 15 minutes  Past Psychiatric History:   Past Medical History:  Past Medical History:  Diagnosis Date  . 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 No pertinent family history. Family Psychiatric  History: Social History:  Social History   Substance and Sexual Activity  Alcohol Use Not Currently     Social History   Substance and Sexual Activity  Drug Use Yes  . Types: Marijuana, Methamphetamines   Comment: patient is evasive in disclosing specifics concerning his drug use    Social History   Socioeconomic History  . Marital status: Divorced    Spouse name: Not on file  . Number of children: Not on file  . Years of education: Not on file  . Highest education level: Not on file  Occupational History  . Not on file  Social Needs  . Financial resource strain: Not on file  . Food insecurity:    Worry: Not on file    Inability: Not on  file  . Transportation needs:    Medical: Not on file    Non-medical: Not on file  Tobacco Use  . Smoking status: Current Every Day Smoker    Types: Cigarettes    Start date: 06/30/2011  . Smokeless tobacco: Never Used  Substance and Sexual Activity  . Alcohol use: Not Currently  . Drug use: Yes    Types: Marijuana, Methamphetamines    Comment: patient is evasive in disclosing specifics concerning his drug use  . Sexual activity: Not Currently  Lifestyle  . Physical activity:    Days per week: Not on file    Minutes per session: Not on file  . Stress: Not on file  Relationships  . Social connections:    Talks on phone: Not on file    Gets together: Not on file    Attends religious service: Not on file    Active member of club or organization: Not on file    Attends meetings of clubs or organizations: Not on file    Relationship status: Not on file  Other Topics Concern  . Not on file  Social History Narrative   ** Merged History Encounter **       Additional Social History:   Sleep: improving  Appetite:  Good  Current Medications: Current Facility-Administered Medications  Medication Dose Route Frequency Provider Last Rate Last Dose  . alum & mag hydroxide-simeth (MAALOX/MYLANTA) 200-200-20 MG/5ML suspension 30 mL  30 mL Oral Q4H PRN Mordecai Maes, NP   30 mL  at 07/31/18 2143  . hydrOXYzine (ATARAX/VISTARIL) tablet 25 mg  25 mg Oral Q6H PRN Asriel Westrup, Myer Peer, MD   25 mg at 07/31/18 2051  . LORazepam (ATIVAN) tablet 1 mg  1 mg Oral Q6H PRN Carolanne Mercier, Myer Peer, MD   1 mg at 07/30/18 1100  . multivitamin with minerals tablet 1 tablet  1 tablet Oral Daily Samani Deal, Myer Peer, MD   1 tablet at 08/01/18 0932  . OLANZapine (ZYPREXA) tablet 10 mg  10 mg Oral QHS Jayvon Mounger, Myer Peer, MD   10 mg at 07/31/18 2051  . OLANZapine (ZYPREXA) tablet 5 mg  5 mg Oral BH-q7a Rodney Wigger, Myer Peer, MD   5 mg at 08/01/18 4034  . OLANZapine zydis (ZYPREXA) disintegrating tablet 5 mg  5 mg Oral Q8H  PRN Bryse Blanchette, Myer Peer, MD   5 mg at 08/01/18 1154   And  . ziprasidone (GEODON) injection 10 mg  10 mg Intramuscular PRN Icesis Renn, Myer Peer, MD      . thiamine (B-1) injection 100 mg  100 mg Intramuscular Once Allina Riches A, MD      . thiamine (VITAMIN B-1) tablet 100 mg  100 mg Oral Daily Taras Rask, Myer Peer, MD   100 mg at 08/01/18 0932  . traZODone (DESYREL) tablet 100 mg  100 mg Oral QHS,MR X 1 Laverle Hobby, PA-C   100 mg at 07/31/18 2230    Lab Results:  No results found for this or any previous visit (from the past 48 hour(s)).  Blood Alcohol level:  Lab Results  Component Value Date   ETH <10 07/28/2018   ETH <10 74/25/9563    Metabolic Disorder Labs: Lab Results  Component Value Date   HGBA1C 6.1 (H) 07/30/2018   MPG 128.37 07/30/2018   No results found for: PROLACTIN Lab Results  Component Value Date   CHOL 184 07/30/2018   TRIG 144 07/30/2018   HDL 43 07/30/2018   CHOLHDL 4.3 07/30/2018   VLDL 29 07/30/2018   LDLCALC 112 (H) 07/30/2018    Physical Findings: AIMS: Facial and Oral Movements Muscles of Facial Expression: None, normal Lips and Perioral Area: None, normal Jaw: None, normal Tongue: None, normal,Extremity Movements Upper (arms, wrists, hands, fingers): None, normal Lower (legs, knees, ankles, toes): None, normal, Trunk Movements Neck, shoulders, hips: None, normal, Overall Severity Severity of abnormal movements (highest score from questions above): None, normal Incapacitation due to abnormal movements: None, normal Patient's awareness of abnormal movements (rate only patient's report): No Awareness, Dental Status Current problems with teeth and/or dentures?: No Does patient usually wear dentures?: No  CIWA:  CIWA-Ar Total: 2 COWS:     Musculoskeletal: Strength & Muscle Tone: within normal limits Gait & Station: normal Patient leans: N/A  Psychiatric Specialty Exam: Physical Exam  Constitutional: He appears well-developed.   Psychiatric: He has a normal mood and affect. His behavior is normal.    Review of Systems  Psychiatric/Behavioral: Positive for depression, hallucinations and suicidal ideas. The patient is nervous/anxious.      Blood pressure 121/90, pulse (!) 108, temperature (!) 97.5 F (36.4 C), temperature source Oral, resp. rate 18, height 5\' 7"  (1.702 m), weight 70.3 kg, SpO2 98 %.Body mass index is 24.28 kg/m.  General Appearance: Casual  Eye Contact:  Good  Speech:  less pressured   Volume:  Normal  Mood:  Anxious, Depressed and Irritable  Affect:  Labile  Thought Process:  Disorganized  Orientation:  Full (Time, Place, and Person)  Thought Content:  denies hallucinations, paranoid and grandiose ideations as above   Suicidal Thoughts:  No   Homicidal Thoughts:  No  Memory:  recent and remote fair   Judgement:  Impaired  Insight:  Lacking  Psychomotor Activity:  Normal  Concentration:  Concentration: Fair and Attention Span: Fair  Recall:  [employer] of Knowledge:  Fair  Language:  Fair  Akathisia:  Negative  Handed:  Right  AIMS (if indicated):     Assets:  Desire for Improvement Resilience  ADL's:  Improving   Cognition:  WNL  Sleep:  Number of Hours: 5.5     Treatment Plan Summary: Daily contact with patient to assess and evaluate symptoms and progress in treatment, Medication management, Plan inpatient treatment and medications as below   Continue with current treatment Plan reviewed as below today 08/01/2018 as listed below except where noted  Major Depression with psychosis    Continue  Zyprexa to 5 mgrs QAM and 10 mgrs QHS for psychosis, mood disorder  Continue Ativan PRN for WDL symptoms if needed  Agitation protocol for acute agitation as needed ( Ativan/Zyprexa PO / Geodon IM)  Started cogentin 0.5mg  Po BID  Encourage group and milieu participation Encourage efforts to work on sobriety, abstinence   Treatment team working on Development worker, international aid . Will review  with CSW to obtain written consent /collateral information  Derrill [place], NP 08/01/2018, 11:59 AM  Attest to NP Progress Note

## 2018-08-01 NOTE — B[facility] Counselor (Signed)
Adult Comprehensive Assessment  Patient ID: Randall [name], male   DOB: Sep 09, 1965, 53 y.o.   MRN: [id number]  Information Source: Information source: (Information provided by Sister Randall [name] [phone], because patient remains too psychotic to assess.  Mother was also present, so at times daughter would ask her questions.)  Current Stressors:  Patient states their primary concerns and needs for treatment are:: Sister's concern is that patient thinks people are trying to kill him with his cancer medications, and therefore is not getting any treatment for his leukemia Patient states their goals for this hospitilization and ongoing recovery are:: Get well and stable Family Relationships: Not good relationships with any family members - only calls them when he needs something Financial / Lack of resources (include bankruptcy): Has disability, very small income, controls it himself. Housing / Lack of housing: Homeless for almost 1 year and often gets kicked out of places due to his mental health issues, substance abuse, and anger. Physical health (include injuries & life threatening diseases): Has cancer (Stage 3 leukemia) currently, has not been taking his medicine.  Has had this since 2008, in remission at one point, got it again in 2018. Substance abuse: Tells sister he is a drug addict.  Was doing heroin "awhile back" and "takes pills because of the pain from the cancer."  He also was doing meth. Bereavement / Loss: Girlfriend and some other friends overdosed on heroin  Living/Environment/Situation:  Living Arrangements: Other (Comment) Living conditions (as described by patient or guardian): Homeless, living with a friend then with another friend, then a shelter and was kicked out of that. Who else lives in the home?: Nobody How long has patient lived in current situation?: Since getting out of jail in June 2019. What is atmosphere in current home: Chaotic, Dangerous  Family  History:  Marital status: Divorced Are you sexually active?: (Unknown) What is your sexual orientation?: Straight Does patient have children?: Yes How many children?: 4 How is patient's relationship with their children?: Oldest - no relationship; 2 youngest - does not see them; 53yo - talks to them "here and there"  Childhood History:  By whom was/is the patient raised?: Both parents Description of patient's relationship with caregiver when they were a child: Not good with either parent Patient's description of current relationship with people who raised him/her: Mother - does not talk to him much because of his life choices; Father - same, because he only calls when he needs help. How were you disciplined when you got in trouble as a child/adolescent?: (Not discussed) Does patient have siblings?: Yes Number of Siblings: 3 Description of patient's current relationship with siblings: 2 sisters, 1 brother - Randall [name] is the closest to him, none are in [place], all in [place] Did patient suffer any verbal/emotional/physical/sexual abuse as a child?: No Did patient suffer from severe childhood neglect?: No Has patient ever been sexually abused/assaulted/raped as an adolescent or adult?: No Was the patient ever a victim of a crime or a disaster?: No Witnessed domestic violence?: No Has patient been effected by domestic violence as an adult?: (Unknown)  Education:  Highest grade of school patient has completed: 11th grade Currently a student?: No Learning disability?: No  Employment/Work Situation:   Employment situation: On disability Why is patient on disability: Leukemia How long has patient been on disability: Off and on since 2008 Where was the patient employed at that time?: [employer] Did You Receive Any Psychiatric Treatment/Services While in the [employer]?: (No [employer])  Are There Guns or Other Weapons in [place]?: No  Financial Resources:   Financial  resources: Receives SSI, [place] Does patient have a representative payee or guardian?: No  Alcohol/Substance Abuse:   What has been your use of drugs/alcohol within the last 12 months?: Unknown by sister.  He has told her he is trying to remain sober.  He has a history with heroin, methampethamines, and "pills" for pain. 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  Has been hospitalized several times. Has alcohol/substance abuse ever caused legal problems?: (Has been in prison with drug-related charges.)  Social Support System:   Patient's Community Support System: Poor Describe Community Support System: Family in [place] Type of faith/religion: Born [place] How does patient's faith help to cope with current illness?: Unknown  Leisure/Recreation:      Strengths/Needs:      Discharge Plan:   Currently receiving community mental health services: No Patient states concerns and preferences for aftercare planning are: This will have to be addressed to patient when he is more stable. Does patient have access to transportation?: No Does patient have financial barriers related to discharge medications?: Yes Patient description of barriers related to discharge medications: Has disability income and Medicaid Plan for no access to transportation at discharge: Bus pass may be needed Will patient be returning to same living situation after discharge?: (Unknown)  Summary/Recommendations:   Summary and Recommendations (to be completed by the evaluator): Patient is a 53yo male admitted with suicide attempt, bag tied tightly around his neck at the ED, and acute psychosis.  During the assessment for admission, he talked about attempting to kill himself 23 times per day and endorsed thoughts of harming others.  He has a history of using heroin, pills, and methamphetamines and has been in treatment several times.  A girlfriend  and multiple friends have died from heroin overdoses.  He has been homeless since being released from prison in June 2019, has lived with different friends and been kicked out of a [employer].  The patient has leukemia which had gone into remission but has been back for several years.  He received treatment for it while in prison, but when released, he refused treatment, stating that the doctors were trying to kill him with the medicine.  He has few family supports and this assessment was done with his sister Randall [name] and his mother at [phone].  Patient will benefit from crisis stabilization, medication evaluation, group therapy and psychoeducation, in addition to case management for discharge planning. At discharge it is recommended that Patient adhere to the established discharge plan and continue in treatment.  Maretta Los. 08/01/2018

## 2018-08-01 NOTE — Progress Notes (Signed)
[place] NOVEL CORONAVIRUS (COVID-19) DAILY CHECK-OFF SYMPTOMS - answer yes or no to each - every day NO YES  Have you had a fever in the past 24 hours?  . Fever (Temp > 37.80C / 100F) X   Have you had any of these symptoms in the past 24 hours? . New Cough .  Sore Throat  .  Shortness of Breath .  Difficulty Breathing .  Unexplained Body Aches   X   Have you had any one of these symptoms in the past 24 hours not related to allergies?   . Runny Nose .  Nasal Congestion .  Sneezing   X   If you have had runny nose, nasal congestion, sneezing in the past 24 hours, has it worsened?  X   EXPOSURES - check yes or no X   Have you traveled outside the state in the past 14 days?  X   Have you been in contact with someone with a confirmed diagnosis of COVID-19 or PUI in the past 14 days without wearing appropriate PPE?  X   Have you been living in the same home as a person with confirmed diagnosis of COVID-19 or a PUI (household contact)?    X   Have you been diagnosed with COVID-19?    X              What to do next: Answered NO to all: Answered YES to anything:   Proceed with unit schedule Follow the [facility] Inpatient Flowsheet.   

## 2018-08-01 NOTE — [facility] Suicide Risk Assessment (Signed)
[place] INPATIENT:  Family/Significant Other Suicide Prevention Education  Suicide Prevention Education:  Education Completed;  sister Aidon Klemens [phone],  (name of family member/significant other) has been identified by the patient as the family member/significant other with whom the patient will be residing, and identified as the person(s) who will aid the patient in the event of a mental health crisis (suicidal ideations/suicide attempt).  With written consent from the patient, the family member/significant other has been provided the following suicide prevention education, prior to the and/or following the discharge of the patient.  The suicide prevention education provided includes the following:  Suicide risk factors  Suicide prevention and interventions  National Suicide Hotline telephone number  [facility] assessment telephone number  [facility] Emergency Assistance [place] and/or Residential Mobile Crisis Unit telephone number  Request made of family/significant other to:  Remove weapons (e.g., guns, rifles, knives), all items previously/currently identified as safety concern.    Remove drugs/medications (over-the-counter, prescriptions, illicit drugs), all items previously/currently identified as a safety concern.  The family member/significant other verbalizes understanding of the suicide prevention education information provided.  The family member/significant other agrees to remove the items of safety concern listed above.  Randall [name] 08/01/2018, 1:44 PM

## 2018-08-02 DIAGNOSIS — F250 Schizoaffective disorder, bipolar type: Principal | ICD-10-CM

## 2018-08-02 MED ORDER — ZIPRASIDONE MESYLATE 20 MG IM SOLR
20.0000 mg | Freq: Once | INTRAMUSCULAR | Status: AC
  Administered 2018-08-02: 20 mg via INTRAMUSCULAR
  Filled 2018-08-02 (×2): qty 20

## 2018-08-02 MED ORDER — LORAZEPAM 2 MG/ML IJ SOLN
4.0000 mg | Freq: Once | INTRAMUSCULAR | Status: AC
  Administered 2018-08-02: 4 mg via INTRAMUSCULAR
  Filled 2018-08-02: qty 2

## 2018-08-02 MED ORDER — DIVALPROEX SODIUM 125 MG PO CSDR
250.0000 mg | DELAYED_RELEASE_CAPSULE | Freq: Three times a day (TID) | ORAL | Status: DC
  Filled 2018-08-02 (×9): qty 2

## 2018-08-02 MED ORDER — HALOPERIDOL 5 MG PO TABS
10.0000 mg | ORAL_TABLET | Freq: Three times a day (TID) | ORAL | Status: DC
  Filled 2018-08-02: qty 2

## 2018-08-02 MED ORDER — HALOPERIDOL LACTATE 5 MG/ML IJ SOLN
10.0000 mg | Freq: Four times a day (QID) | INTRAMUSCULAR | Status: DC | PRN
  Administered 2018-08-03: 10 mg via INTRAMUSCULAR
  Filled 2018-08-02: qty 2

## 2018-08-02 MED ORDER — DIPHENHYDRAMINE HCL 50 MG PO CAPS
50.0000 mg | ORAL_CAPSULE | Freq: Three times a day (TID) | ORAL | Status: DC
  Administered 2018-08-04: 50 mg via ORAL
  Filled 2018-08-02 (×3): qty 1
  Filled 2018-08-02: qty 2
  Filled 2018-08-02 (×9): qty 1

## 2018-08-02 MED ORDER — HALOPERIDOL 5 MG PO TABS
15.0000 mg | ORAL_TABLET | Freq: Three times a day (TID) | ORAL | Status: DC
  Administered 2018-08-04: 15 mg via ORAL
  Filled 2018-08-02 (×12): qty 3

## 2018-08-02 MED ORDER — LORAZEPAM 2 MG/ML IJ SOLN
4.0000 mg | INTRAMUSCULAR | Status: DC | PRN
  Filled 2018-08-02: qty 2

## 2018-08-02 NOTE — [facility] Group Notes (Signed)
[place] LCSW Group Therapy Note  Date/Time: 08/02/18, 1315  Type of Therapy and Topic:  Group Therapy:  Overcoming Obstacles  Participation Level:  Did not attend  Description of Group:    In this group patients will be encouraged to explore what they see as obstacles to their own wellness and recovery. They will be guided to discuss their thoughts, feelings, and behaviors related to these obstacles. The group will process together ways to cope with barriers, with attention given to specific choices patients can make. Each patient will be challenged to identify changes they are motivated to make in order to overcome their obstacles. This group will be process-oriented, with patients participating in exploration of their own experiences as well as giving and receiving support and challenge from other group members.  Therapeutic Goals: 1. Patient will identify personal and current obstacles as they relate to admission. 2. Patient will identify barriers that currently interfere with their wellness or overcoming obstacles.  3. Patient will identify feelings, thought process and behaviors related to these barriers. 4. Patient will identify two changes they are willing to make to overcome these obstacles:    Summary of Patient Progress      Therapeutic Modalities:   Cognitive Behavioral Therapy Solution Focused Therapy Motivational Interviewing Relapse Prevention Therapy  Lurline Idol, LCSW

## 2018-08-02 NOTE — Progress Notes (Signed)
[facility] MD Progress Note  08/02/2018 10:20 AM Randall [name]  MRN:  [id number] Subjective:    Patient remains threatening volatile agitated threw coffee on the nurses station, knocks on my door, says "Fuck you!" a lot cussing threatening self, staff, so forth will require IM medications quite volatile and dangerous at this point in time Principal Problem: MDD (major depressive disorder) Diagnosis: Principal Problem:   MDD (major depressive disorder) Active Problems:   Schizoaffective disorder, bipolar type ([place])  Total Time spent with patient: 30 mins- so intrusive as to receive multiple interviews  Past Psychiatric History: 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 No pertinent family history. Family Psychiatric  History: neg Social History:  Social History   Substance and Sexual Activity  Alcohol Use Not Currently     Social History   Substance and Sexual Activity  Drug Use Yes  . Types: Marijuana, Methamphetamines   Comment: patient is evasive in disclosing specifics concerning his drug use    Social History   Socioeconomic History  . Marital status: Divorced    Spouse name: Not on file  . Number of children: Not on file  . Years of education: Not on file  . Highest education level: Not on file  Occupational History  . Not on file  Social Needs  . Financial resource strain: Not on file  . Food insecurity:    Worry: Not on file    Inability: Not on file  . Transportation needs:    Medical: Not on file    Non-medical: Not on file  Tobacco Use  . Smoking status: Current Every Day Smoker    Types: Cigarettes    Start date: 06/30/2011  .  Smokeless tobacco: Never Used  Substance and Sexual Activity  . Alcohol use: Not Currently  . Drug use: Yes    Types: Marijuana, Methamphetamines    Comment: patient is evasive in disclosing specifics concerning his drug use  . Sexual activity: Not Currently  Lifestyle  . Physical activity:    Days per week: Not on file    Minutes per session: Not on file  . Stress: Not on file  Relationships  . Social connections:    Talks on phone: Not on file    Gets together: Not on file    Attends religious service: Not on file    Active member of club or organization: Not on file    Attends meetings of clubs or organizations: Not on file    Relationship status: Not on file  Other Topics Concern  . Not on file  Social History Narrative   ** Merged History Encounter **       Additional Social History:                         Sleep: Poor  Appetite:  Fair  Current Medications: Current Facility-Administered Medications  Medication Dose Route Frequency Provider Last Rate Last Dose  . alum & mag hydroxide-simeth (MAALOX/MYLANTA) 200-200-20 MG/5ML suspension 30 mL  30 mL Oral Q4H PRN Mordecai Maes, NP   30 mL at 07/31/18 2143  . diphenhydrAMINE (BENADRYL) capsule 50 mg  50 mg Oral Q8H PRN  Derrill Center, NP   50 mg at 08/02/18 0744   Or  . diphenhydrAMINE (BENADRYL) injection 50 mg  50 mg Intramuscular Q6H PRN Derrill Center, NP   50 mg at 08/01/18 1836  . diphenhydrAMINE (BENADRYL) capsule 50 mg  50 mg Oral TID Johnn Hai, MD      . haloperidol (HALDOL) tablet 15 mg  15 mg Oral TID Johnn Hai, MD      . haloperidol lactate (HALDOL) injection 10 mg  10 mg Intramuscular Q6H PRN Johnn Hai, MD      . LORazepam (ATIVAN) injection 4 mg  4 mg Intravenous Q4H PRN Johnn Hai, MD      . multivitamin with minerals tablet 1 tablet  1 tablet Oral Daily Cobos, Myer Peer, MD   1 tablet at 08/02/18 0744  . OLANZapine zydis (ZYPREXA) disintegrating tablet 5 mg  5 mg Oral Q8H PRN  Derrill Center, NP      . thiamine (B-1) injection 100 mg  100 mg Intramuscular Once Cobos, Fernando A, MD      . thiamine (VITAMIN B-1) tablet 100 mg  100 mg Oral Daily Cobos, Myer Peer, MD   100 mg at 08/02/18 0744  . traZODone (DESYREL) tablet 100 mg  100 mg Oral QHS,MR X 1 Laverle Hobby, PA-C   100 mg at 07/31/18 2230    Lab Results: No results found for this or any previous visit (from the past 48 hour(s)).  Blood Alcohol level:  Lab Results  Component Value Date   ETH <10 07/28/2018   ETH <10 35/32/9924    Metabolic Disorder Labs: Lab Results  Component Value Date   HGBA1C 6.1 (H) 07/30/2018   MPG 128.37 07/30/2018   No results found for: PROLACTIN Lab Results  Component Value Date   CHOL 184 07/30/2018   TRIG 144 07/30/2018   HDL 43 07/30/2018   CHOLHDL 4.3 07/30/2018   VLDL 29 07/30/2018   LDLCALC 112 (H) 07/30/2018    Physical Findings: AIMS: Facial and Oral Movements Muscles of Facial Expression: None, normal Lips and Perioral Area: None, normal Jaw: None, normal Tongue: None, normal,Extremity Movements Upper (arms, wrists, hands, fingers): None, normal Lower (legs, knees, ankles, toes): None, normal, Trunk Movements Neck, shoulders, hips: None, normal, Overall Severity Severity of abnormal movements (highest score from questions above): None, normal Incapacitation due to abnormal movements: None, normal Patient's awareness of abnormal movements (rate only patient's report): No Awareness, Dental Status Current problems with teeth and/or dentures?: No Does patient usually wear dentures?: No  CIWA:  CIWA-Ar Total: 0 COWS:     Musculoskeletal: Strength & Muscle Tone: within normal limits Gait & Station: normal Patient leans: N/A  Psychiatric Specialty Exam: Physical Exam  ROS  Blood pressure (!) 135/93, pulse (!) 104, temperature 97.8 F (36.6 C), temperature source Oral, resp. rate 16, height 5\' 7"  (1.702 m), weight 70.3 kg, SpO2 98 %.Body mass  index is 24.28 kg/m.  General Appearance: Disheveled  Eye Contact:  Minimal  Speech:  Pressured  Volume:  Increased  Mood:  Angry, Irritable and Threatening violence against examiner and others  Affect:  Congruent  Thought Process:  Irrelevant and Descriptions of Associations: Loose  Orientation:  Other:  Person place situation  Thought Content:  Illogical and Paranoid Ideation  Suicidal Thoughts:  No  Homicidal Thoughts:  Yes.  with intent/plan  Memory:  Immediate;   Poor  Judgement:  Impaired  Insight:  Lacking  Psychomotor Activity:  Increased  Concentration:  Concentration: Poor  Recall:  Poor  Fund of Knowledge:  Poor  Language:  Poor  Akathisia:  Negative  Handed:  Right  AIMS (if indicated):     Assets:  Leisure Time  ADL's:  Intact  Cognition:  WNL  Sleep:  Number of Hours: 4     Treatment Plan Summary: Daily contact with patient to assess and evaluate symptoms and progress in treatment, Medication management and Plan Acutely needs Geodon and Ativan will escalate oral Haldol add Benadryl as well and Depakote clearly sociopath threatening volatile in the context of polysubstance dependencies and abuse and psychosis as result  Marnell Mcdaniel, MD 08/02/2018, 10:20 AM

## 2018-08-02 NOTE — Progress Notes (Signed)
Recreation Therapy Notes  Date: 6.1.20 Time: 1000 Location: 500 Hall Dayroom  Group Topic: Coping Skills  Goal Area(s) Addresses:  Patient will identify the things that are holding them back. Patient will identify coping skills to deal with those things.  Behavioral Response: None  Intervention: Worksheet, pencils  Activity:  Building surveyor.  Patients were to identify things they feel are holding them back and write them inside the web.  Patients would then identify coping skills to deal with each challenge they identified and write it on the outside of the web.  Education:Coping Skills, Discharge Planning.   Education Outcome: Acknowledges understanding/In group clarification offered/Needs additional education.   Clinical Observations/Feedback:  Patient sat in group and socialized with his peers. Pt was redirected .  Pt eventually left and did not return.   Victorino Sparrow, LRT/CTRS        Ria Comment, Claudia Greenley A 08/02/2018 11:50 AM

## 2018-08-02 NOTE — Plan of Care (Signed)
Progress note  D: pt found in the hallway; compliant with medication administration. Pt is intrusive, verbally abusive, loud, disruptive, and taking away from others experiences. Pt is threatening staff and other patients. Pt was given 4 mg ativan and 20 mg Geodon IM. Pt became even more agitated with this order and was cursing staff, throwing his drink, and banging on the physicians door. Pt is asleep now in his room. Pt denies si/hi/ah/vh and verbally agrees to approach staff if these become apparent or before harming himself/others while at [facility]. Pt is non-redirectable and threatening others though.  A: pt provided support and encouragement. Pt given medication per protocol and standing orders. Q60m safety checks implemented and continued.  R: Pt safe on the unit. Will continue to monitor.   Pt not progressing in the following metrics  Problem: Education: Goal: Knowledge of [facility] Education information/materials will improve Outcome: Not Progressing Goal: Emotional status will improve Outcome: Not Progressing Goal: Mental status will improve Outcome: Not Progressing

## 2018-08-02 NOTE — Plan of Care (Addendum)
D: Patient is alert and labile. Denies SI, HI, AVH, and verbally contracts for safety. Patient reports a good day. He is flirtatious, disorganized, paranoid, and verbally aggressive when discussing medications. He is refusing to take his scheduled medications. Patient denies physical symptoms/pain. When receiving a cup of water patient asks "is there medicine in here?"    A: Medications not administered per MD order. Support provided. Patient educated on safety on the unit and medications. Routine safety checks every 15 minutes. Patient stated understanding to tell nurse about any new physical symptoms. Patient understands to tell staff of any needs.     R: No adverse drug reactions noted. Patient verbally contracts for safety. Patient remains safe at this time and will continue to monitor.   Problem: Medication: Goal: Compliance with prescribed medication regimen will improve Outcome: Not Progressing   Patient is refusing medication. Patient remains safe and will continue to monitor.   [place] NOVEL CORONAVIRUS (COVID-19) DAILY CHECK-OFF SYMPTOMS - answer yes or no to each - every day NO YES  Have you had a fever in the past 24 hours?  . Fever (Temp > 37.80C / 100F) X   Have you had any of these symptoms in the past 24 hours? . New Cough .  Sore Throat  .  Shortness of Breath .  Difficulty Breathing .  Unexplained Body Aches   X   Have you had any one of these symptoms in the past 24 hours not related to allergies?   . Runny Nose .  Nasal Congestion .  Sneezing   X   If you have had runny nose, nasal congestion, sneezing in the past 24 hours, has it worsened?  X   EXPOSURES - check yes or no X   Have you traveled outside the state in the past 14 days?  X   Have you been in contact with someone with a confirmed diagnosis of COVID-19 or PUI in the past 14 days without wearing appropriate PPE?  X   Have you been living in the same home as a person with confirmed diagnosis of  COVID-19 or a PUI (household contact)?    X   Have you been diagnosed with COVID-19?    X              What to do next: Answered NO to all: Answered YES to anything:   Proceed with unit schedule Follow the [facility] Inpatient Flowsheet.

## 2018-08-03 MED ORDER — HALOPERIDOL DECANOATE 100 MG/ML IM SOLN
150.0000 mg | INTRAMUSCULAR | Status: DC
  Administered 2018-08-04: 150 mg via INTRAMUSCULAR
  Filled 2018-08-03: qty 1.5

## 2018-08-03 MED ORDER — LORAZEPAM 2 MG/ML IJ SOLN
2.0000 mg | Freq: Once | INTRAMUSCULAR | Status: AC
  Administered 2018-08-03: 03:00:00 2 mg via INTRAMUSCULAR

## 2018-08-03 MED ORDER — PROPRANOLOL HCL 20 MG PO TABS
20.0000 mg | ORAL_TABLET | Freq: Two times a day (BID) | ORAL | Status: DC
  Filled 2018-08-03 (×6): qty 1

## 2018-08-03 MED ORDER — ILOPERIDONE 4 MG PO TABS
2.0000 mg | ORAL_TABLET | Freq: Three times a day (TID) | ORAL | Status: DC
  Administered 2018-08-04: 2 mg via ORAL
  Filled 2018-08-03 (×9): qty 1

## 2018-08-03 MED ORDER — LORAZEPAM 2 MG/ML IJ SOLN
4.0000 mg | INTRAMUSCULAR | Status: DC | PRN
  Administered 2018-08-03: 4 mg via INTRAMUSCULAR
  Filled 2018-08-03: qty 2

## 2018-08-03 MED ORDER — DIVALPROEX SODIUM 500 MG PO DR TAB
500.0000 mg | DELAYED_RELEASE_TABLET | Freq: Three times a day (TID) | ORAL | Status: DC
  Administered 2018-08-04: 500 mg via ORAL
  Filled 2018-08-03 (×10): qty 1

## 2018-08-03 NOTE — Plan of Care (Signed)
D: Patient is in bed asleep on approach. Patient woke around 10 pm and is irritable and demanding. He will not answer assessment questions but states "ok I'll play yalls little games".    Patient refuses to take night time medications.   A: Support provided. Patient educated on safety on the unit and medications. Routine safety checks every 15 minutes. Patient stated understanding to tell nurse about any new physical symptoms. Patient understands to tell staff of any needs.     R: No adverse drug reactions noted. Patient verbally contracts for safety. Patient remains safe at this time and will continue to monitor.   Problem: Medication: Goal: Compliance with prescribed medication regimen will improve Outcome: Not Progressing   Patient still refusing medications.   [place] NOVEL CORONAVIRUS (COVID-19) DAILY CHECK-OFF SYMPTOMS - answer yes or no to each - every day NO YES  Have you had a fever in the past 24 hours?  Fever (Temp > 37.80C / 100F) X   Have you had any of these symptoms in the past 24 hours? New Cough  Sore Throat   Shortness of Breath  Difficulty Breathing  Unexplained Body Aches   X   Have you had any one of these symptoms in the past 24 hours not related to allergies?   Runny Nose  Nasal Congestion  Sneezing   X   If you have had runny nose, nasal congestion, sneezing in the past 24 hours, has it worsened?  X   EXPOSURES - check yes or no X   Have you traveled outside the state in the past 14 days?  X   Have you been in contact with someone with a confirmed diagnosis of COVID-19 or PUI in the past 14 days without wearing appropriate PPE?  X   Have you been living in the same home as a person with confirmed diagnosis of COVID-19 or a PUI (household contact)?    X   Have you been diagnosed with COVID-19?    X              What to do next: Answered NO to all: Answered YES to anything:   Proceed with unit schedule Follow the [facility] Inpatient Flowsheet.
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
## 2018-08-03 NOTE — Progress Notes (Signed)
D-pt has been very labile today, but mostly verbally aggressive.  Pt has refused all medications and refused his chest xray this morning.  About 5 minutes ago pt came to nurses station upset b/c now he decides he wants a chest xray.  Charge nurse, Estill Bamberg, notified.  Will pass along to night RN.  Pt sts that he doesn't want the shoes that the chaplain brought him.  Pt has been very rude, disrespectful and threatening towards staff.  Pt sts hes thinking about a way he can escape the unit tonight.   A-pt refuses all medications today, Dr. Jake Samples notified, pt cussed and threatened staff multiple times throughout the day R-cont to monitor for safety
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
## 2018-08-03 NOTE — Progress Notes (Signed)
Patient sitting in dayroom drinking coffee.  Patient refused to go get his chest xray and threaten by saying "you better get the fuck out of my face."  MD notified.

## 2018-08-03 NOTE — Progress Notes (Signed)
Pt was verbally aggressive pt was asked if he was threatening writer" yeah I'm fucking threatening you, I don't give a fuck" . " I heard you were a fag" . Pt constantly making statements to try to anger writer. Pt informed his words do not bother Probation officer. Pt woke up another pt and was informed he could not wake up other patients.

## 2018-08-03 NOTE — Progress Notes (Signed)
Spiritual care providing size 9 shoe and medium shirt for patient.  Unfortunately, clothing closet does not currently have men's pants.

## 2018-08-03 NOTE — Progress Notes (Signed)
Patient requested shoes and clothing due to being homeless and hoping to be d/ced tomorrow.  Chaplain. Matt, at [facility] notified and he said he would bring over shoes and clothing to [facility] before 5pm today.
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
## 2018-08-03 NOTE — Progress Notes (Signed)
"  I'm going to get more violent, I'm going to get so violent  You'll see"

## 2018-08-03 NOTE — Progress Notes (Signed)
[facility] MD Progress Note  08/03/2018 11:54 AM Randall [name]  MRN:  [id number] Subjective:    This morning patient is still disrespectful abusive in his language using profanity, cuss words - telling examiners to "get out" he further complained of a bloody nose and then coughed up some blood but it seemed to be simply the blood from his nose.  At any rate we ordered a chest x-ray to rule out other pathologies but he refuses to cooperate with this.  Again remains irritable poorly cooperative and intermittently explosive we will add long-acting injectable and escalate Depakote.  Last night 3 AM noted to be sexual inappropriate physically aggressive refusing to be de-escalated yelling cursing throwing things so forth blaming his medication again very problematic individual to treat.  Needed IM medications Principal Problem: MDD (major depressive disorder) Diagnosis: Principal Problem:   MDD (major depressive disorder) Active Problems:   Schizoaffective disorder, bipolar type ([place])  Total Time spent with patient: 20 minutes  Past Psychiatric History: Methamphetamine abuse and dependency  Past Medical History:  Past Medical History:  Diagnosis Date  . CML (chronic myelocytic leukemia) ([place]) 2008    Past Surgical History:  Procedure Laterality Date  . 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 No pertinent family history. Family Psychiatric  History: neg Social History:  Social History   Substance and Sexual Activity  Alcohol Use Not Currently     Social History   Substance and Sexual Activity  Drug Use Yes  . Types: Marijuana, Methamphetamines   Comment: patient is evasive in disclosing specifics concerning his drug use    Social History   Socioeconomic History  . Marital status: Divorced    Spouse name: Not on file  . Number of children: Not on file  . Years of education: Not on file  . Highest education level: Not on file  Occupational History  .  Not on file  Social Needs  . Financial resource strain: Not on file  . Food insecurity:    Worry: Not on file    Inability: Not on file  . Transportation needs:    Medical: Not on file    Non-medical: Not on file  Tobacco Use  . Smoking status: Current Every Day Smoker    Types: Cigarettes    Start date: 06/30/2011  . Smokeless tobacco: Never Used  Substance and Sexual Activity  . Alcohol use: Not Currently  . Drug use: Yes    Types: Marijuana, Methamphetamines    Comment: patient is evasive in disclosing specifics concerning his drug use  . Sexual activity: Not Currently  Lifestyle  . Physical activity:    Days per week: Not on file    Minutes per session: Not on file  . Stress: Not on file  Relationships  . Social connections:    Talks on phone: Not on file    Gets together: Not on file    Attends religious service: Not on file    Active member of club or organization: Not on file    Attends meetings of clubs or organizations: Not on file    Relationship status: Not on file  Other Topics Concern  . Not on file  Social History Narrative   ** Merged History Encounter **       Additional Social History:                         Sleep: Fair  Appetite:  Fair  Current Medications: Current Facility-Administered Medications  Medication Dose Route Frequency Provider Last Rate Last Dose  . alum & mag hydroxide-simeth (MAALOX/MYLANTA) 200-200-20 MG/5ML suspension 30 mL  30 mL Oral Q4H PRN Mordecai Maes, NP   30 mL at 07/31/18 2143  . diphenhydrAMINE (BENADRYL) capsule 50 mg  50 mg Oral Q8H PRN Derrill Center, NP   50 mg at 08/02/18 0744   Or  . diphenhydrAMINE (BENADRYL) injection 50 mg  50 mg Intramuscular Q6H PRN Derrill Center, NP   50 mg at 08/03/18 0310  . diphenhydrAMINE (BENADRYL) capsule 50 mg  50 mg Oral TID Johnn Hai, MD      . divalproex (DEPAKOTE SPRINKLE) capsule 500 mg  500 mg Oral TID Johnn Hai, MD      . haloperidol (HALDOL) tablet 15  mg  15 mg Oral TID Johnn Hai, MD      . haloperidol decanoate (HALDOL DECANOATE) 100 MG/ML injection 150 mg  150 mg Intramuscular Q30 days Johnn Hai, MD      . haloperidol lactate (HALDOL) injection 10 mg  10 mg Intramuscular Q6H PRN Johnn Hai, MD   10 mg at 08/03/18 0309  . LORazepam (ATIVAN) injection 4 mg  4 mg Intramuscular Q4H PRN Johnn Hai, MD      . multivitamin with minerals tablet 1 tablet  1 tablet Oral Daily Cobos, Myer Peer, MD   1 tablet at 08/02/18 0744  . OLANZapine zydis (ZYPREXA) disintegrating tablet 5 mg  5 mg Oral Q8H PRN Derrill Center, NP      . propranolol (INDERAL) tablet 20 mg  20 mg Oral BID Johnn Hai, MD      . thiamine (B-1) injection 100 mg  100 mg Intramuscular Once Cobos, Fernando A, MD      . thiamine (VITAMIN B-1) tablet 100 mg  100 mg Oral Daily Cobos, Myer Peer, MD   100 mg at 08/02/18 0744  . traZODone (DESYREL) tablet 100 mg  100 mg Oral QHS,MR X 1 Laverle Hobby, PA-C   100 mg at 07/31/18 2230    Lab Results: No results found for this or any previous visit (from the past 48 hour(s)).  Blood Alcohol level:  Lab Results  Component Value Date   ETH <10 07/28/2018   ETH <10 12/25/8525    Metabolic Disorder Labs: Lab Results  Component Value Date   HGBA1C 6.1 (H) 07/30/2018   MPG 128.37 07/30/2018   No results found for: PROLACTIN Lab Results  Component Value Date   CHOL 184 07/30/2018   TRIG 144 07/30/2018   HDL 43 07/30/2018   CHOLHDL 4.3 07/30/2018   VLDL 29 07/30/2018   LDLCALC 112 (H) 07/30/2018    Physical Findings: AIMS: Facial and Oral Movements Muscles of Facial Expression: None, normal Lips and Perioral Area: None, normal Jaw: None, normal Tongue: None, normal,Extremity Movements Upper (arms, wrists, hands, fingers): None, normal Lower (legs, knees, ankles, toes): None, normal, Trunk Movements Neck, shoulders, hips: None, normal, Overall Severity Severity of abnormal movements (highest score from questions  above): None, normal Incapacitation due to abnormal movements: None, normal Patient's awareness of abnormal movements (rate only patient's report): No Awareness, Dental Status Current problems with teeth and/or dentures?: No Does patient usually wear dentures?: No  CIWA:  CIWA-Ar Total: 2 COWS:     Musculoskeletal: Strength & Muscle Tone: within normal limits Gait & Station: normal Patient leans: N/A  Psychiatric Specialty Exam: Physical Exam  ROS  Blood pressure (!) 142/92, pulse  95, temperature 97.8 F (36.6 C), temperature source Oral, resp. rate 18, height 5\' 7"  (1.702 m), weight 70.3 kg, SpO2 98 %.Body mass index is 24.28 kg/m.  General Appearance: Disheveled  Eye Contact:  None  Speech:  Pressured  Volume:  Increased  Mood: Hostile/angry  Affect:  Labile  Thought Process:  Descriptions of Associations: Loose  Orientation:  Other:  Person place general situation  Thought Content:  Illogical and Delusions  Suicidal Thoughts:  No  Homicidal Thoughts:  Yes.  without intent/plan  Memory:  Immediate;   Poor  Judgement:  Poor  Insight:  Lacking  Psychomotor Activity:  Increased  Concentration:  Concentration: not able to fully assess  Recall:  Poor  Fund of Knowledge:  Poor  Language:  Poor  Akathisia:  Negative  Handed:  Right  AIMS (if indicated):     Assets:  Physical Health  ADL's:  Intact  Cognition:  WNL  Sleep:  Number of Hours: 8     Treatment Plan Summary: Daily contact with patient to assess and evaluate symptoms and progress in treatment and Medication management will administer long-acting injectable haloperidol hopefully that will achieve some stability escalate Depakote continue reality-based therapies  Randall Peral, MD 08/03/2018, 11:54 AM

## 2018-08-03 NOTE — Progress Notes (Signed)
Recreation Therapy Notes  Date: 6.2.20 Time: 1000 Location: 500 Hall Dayroom  Group Topic: Wellness  Goal Area(s) Addresses:  Patient will define components of whole wellness. Patient will verbalize benefit of whole wellness.  Behavioral Response: None  Intervention:  Exercise, Music   Activity:  Exercise.  LRT led group in a series of stretches.  Each group member given the opportunity to lead the group in an exercise of their choosing.  Patients could take breaks and get water as needed.  Education: Wellness, Dentist.   Education Outcome: Acknowledges education/In group clarification offered/Needs additional education.   Clinical Observations/Feedback: Pt arrived to group late.  Pt did not participate.  Pt sat and drank his coffee.   Victorino Sparrow, LRT/CTRS         Victorino Sparrow A 08/03/2018 11:47 AM

## 2018-08-04 MED ORDER — BENZTROPINE MESYLATE 1 MG PO TABS: 1.0000 mg | ORAL_TABLET | Freq: Every day | ORAL | 2 refills | Status: DC

## 2018-08-04 MED ORDER — PROPRANOLOL HCL 40 MG PO TABS: 40.0000 mg | ORAL_TABLET | Freq: Two times a day (BID) | ORAL | 3 refills | Status: DC

## 2018-08-04 MED ORDER — HALOPERIDOL DECANOATE 100 MG/ML IM SOLN
150.0000 mg | Freq: Once | INTRAMUSCULAR | Status: DC
  Filled 2018-08-04: qty 1.5

## 2018-08-04 MED ORDER — DIVALPROEX SODIUM 500 MG PO DR TAB: 1500.0000 mg | DELAYED_RELEASE_TABLET | Freq: Every day | ORAL | 1 refills | Status: DC

## 2018-08-04 MED ORDER — HALOPERIDOL DECANOATE 100 MG/ML IM SOLN: 150.0000 mg | Freq: Once | INTRAMUSCULAR | 11 refills | Status: DC

## 2018-08-04 MED ORDER — HALOPERIDOL 10 MG PO TABS: ORAL_TABLET | ORAL | 2 refills | Status: DC
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
## 2018-08-04 NOTE — Progress Notes (Signed)
CSW and [facility] met with patient in lobby, as he reports he does not have a discharge plan.  Patient states he wants to get to a train station to buy a ticket to [place] (his family live in [place] He states he does not have an ID and needs to get one.  CSW suggested patient follow up with the [facility] to obtain an ID. The [facility] would also be able to provide the patient with shelter resources. Patient agreeable to plan, he states he will take a taxi and he will pay for this himself. CSW provided patient with shelter resources, including the [facility] address.  Stephanie Acre, LCSW-A Clinical Social Worker

## 2018-08-04 NOTE — BHH Group Notes (Signed)
Adult Psychoeducational Group Note  Date:  08/04/2018 Time:  10:01 AM  Group Topic/Focus:  Goals Group:   The focus of this group is to help patients establish daily goals to achieve during treatment and discuss how the patient can incorporate goal setting into their daily lives to aide in recovery.  Participation Level:  Active  Participation Quality:  Appropriate  Affect:  Appropriate  Cognitive:  Alert  Insight: Appropriate  Engagement in Group:  Engaged  Modes of Intervention:  Orientation  Additional Comments:  Pt goal for today is to continue to pray for his family.   Huel Cote 08/04/2018, 10:01 AM

## 2018-08-04 NOTE — Progress Notes (Signed)
  [facility] Adult Case Management Discharge Plan :  Will you be returning to the same living situation after discharge:  No. Patient was discharged prior to [place] living arrangements.  At discharge, do you have transportation home?: No.  Patient was discharged prior to [facility] establishing living arrangements. The busses are free at this time. Do you have the ability to pay for your medications: Yes,  Medicaid.   Release of information consent forms completed and in the chart;  Patient was discharged prior to CSW obtaining ROIs for aftercare.   Patient to Follow up at: Follow-up Information    [facility] Follow up.   Why:  Your hospital discharge appointment will be held by phone. The clinician will call your for your first appointment, please answer the call. Contact information: [address] [place] [address] [phone]           Next level of care provider has access to [place] and Suicide Prevention discussed: Yes,  with sister    Has patient been referred to the Quitline?: Patient refused referral  Patient has been referred for addiction treatment: Pt. refused referral  Joellen Jersey, [place] 08/04/2018, 12:31 PM

## 2018-08-04 NOTE — Progress Notes (Signed)
Patient ID: Randall [name], male   DOB: 07/17/1965, 53 y.o.   MRN: [id number]   D: Patient has been in bed most of the shift thus far tonight. Up x 1 to dayroom for a few minutes. Attempted to start a conversation with him but patient irritable at this time. When the MHT asked about if he wanted his medications he said "screw you". Patient becomes agitated easily. Refused trazodone for sleep.  A: Staff will monitor on q 15 minute checks, follow treatment plan, and give medications as ordered. R: Calm and cooperative on the unit.

## 2018-08-04 NOTE — BHH Suicide Risk Assessment (Signed)
[facility] Discharge Suicide Risk Assessment   Principal Problem: MDD (major depressive disorder) Discharge Diagnoses: Principal Problem:   MDD (major depressive disorder) Active Problems:   Schizoaffective disorder, bipolar type ([place])   Total Time spent with patient: 45 minutes  Musculoskeletal: Strength & Muscle Tone: within normal limits Gait & Station: normal Patient leans: N/A  Psychiatric Specialty Exam: ROS  Blood pressure (!) 141/94, pulse (!) 105, temperature 98 F (36.7 C), temperature source Oral, resp. rate 18, height 5\' 7"  (1.702 m), weight 70.3 kg, SpO2 98 %.Body mass index is 24.28 kg/m.  General Appearance: Disheveled  Eye Contact::  Good  Speech:  Normal Rate409  Volume:  Normal  Mood:  Irritable  Affect:  Full Range  Thought Process:  Linear and Descriptions of Associations: Tangential  Orientation:  Full (Time, Place, and Person)  Thought Content:  Tangential  Suicidal Thoughts:  No  Homicidal Thoughts:  No  Memory:  Recent;   Fair  Judgement:  Poor  Insight:  Shallow  Psychomotor Activity:  Normal  Concentration:  Fair  Recall:  [place]: Only intermittently profane generally holding it together today  Akathisia:  NA  Handed:  Right  AIMS (if indicated):     Assets:  Communication Skills Leisure Time  Sleep:  Number of Hours: 5.75  Cognition: WNL  ADL's:  Intact   Mental Status Per Nursing Assessment::   On Admission:  Suicidal ideation indicated by patient  Demographic Factors:  Male and Unemployed  Loss Factors: Decrease in vocational status  Historical Factors: Impulsivity  Risk Reduction Factors:   Religious beliefs about death  Continued Clinical Symptoms: Irritability and prone to volatility Cognitive Features That Contribute To Risk:  Loss of executive function    Suicide Risk:  Mild:  Suicidal ideation of limited frequency, intensity, duration, and specificity.  There are no identifiable plans, no  associated intent, mild dysphoria and related symptoms, good self-control (both objective and subjective assessment), few other risk factors, and identifiable protective factors, including available and accessible social support.    Plan Of Care/Follow-up recommendations:  Activity:  full  Makinzi Prieur, MD 08/04/2018, 10:05 AM

## 2018-08-05 ENCOUNTER — Other Ambulatory Visit: Payer: Self-pay

## 2018-08-05 ENCOUNTER — Encounter (HOSPITAL_COMMUNITY): Payer: Self-pay

## 2018-08-05 ENCOUNTER — Emergency Department (HOSPITAL_COMMUNITY): Payer: Medicare Other

## 2018-08-05 ENCOUNTER — Emergency Department (HOSPITAL_COMMUNITY)
Admission: EM | Admit: 2018-08-05 | Discharge: 2018-08-05 | Disposition: A | Discharge status: Home / Self Care | Payer: Medicare Other | Attending: Emergency Medicine | Admitting: Emergency Medicine

## 2018-08-05 DIAGNOSIS — R55 Syncope and collapse: Secondary | ICD-10-CM | POA: Insufficient documentation

## 2018-08-05 DIAGNOSIS — Z5900 Homelessness unspecified: Secondary | ICD-10-CM

## 2018-08-05 DIAGNOSIS — F1290 Cannabis use, unspecified, uncomplicated: Secondary | ICD-10-CM | POA: Diagnosis not present

## 2018-08-05 DIAGNOSIS — M549 Dorsalgia, unspecified: Secondary | ICD-10-CM | POA: Diagnosis not present

## 2018-08-05 DIAGNOSIS — Z856 Personal history of leukemia: Secondary | ICD-10-CM | POA: Diagnosis not present

## 2018-08-05 DIAGNOSIS — F250 Schizoaffective disorder, bipolar type: Secondary | ICD-10-CM | POA: Insufficient documentation

## 2018-08-05 DIAGNOSIS — M542 Cervicalgia: Secondary | ICD-10-CM | POA: Diagnosis not present

## 2018-08-05 DIAGNOSIS — Z79899 Other long term (current) drug therapy: Secondary | ICD-10-CM | POA: Diagnosis not present

## 2018-08-05 DIAGNOSIS — F17210 Nicotine dependence, cigarettes, uncomplicated: Secondary | ICD-10-CM | POA: Insufficient documentation

## 2018-08-05 DIAGNOSIS — T675XXA Heat exhaustion, unspecified, initial encounter: Secondary | ICD-10-CM

## 2018-08-05 LAB — CBC WITH DIFFERENTIAL/PLATELET
Abs Immature Granulocytes: 0.3 10*3/uL — ABNORMAL HIGH (ref 0.00–0.07)
Basophils Absolute: 0.1 10*3/uL (ref 0.0–0.1)
Basophils Relative: 1 %
Eosinophils Absolute: 0.1 10*3/uL (ref 0.0–0.5)
Eosinophils Relative: 1 %
HCT: 37.8 % — ABNORMAL LOW (ref 39.0–52.0)
Hemoglobin: 13.1 g/dL (ref 13.0–17.0)
Immature Granulocytes: 2 %
Lymphocytes Relative: 20 %
Lymphs Abs: 3.6 10*3/uL (ref 0.7–4.0)
MCH: 31.6 pg (ref 26.0–34.0)
MCHC: 34.7 g/dL (ref 30.0–36.0)
MCV: 91.1 fL (ref 80.0–100.0)
Monocytes Absolute: 2 10*3/uL — ABNORMAL HIGH (ref 0.1–1.0)
Monocytes Relative: 12 %
Neutro Abs: 11.3 10*3/uL — ABNORMAL HIGH (ref 1.7–7.7)
Neutrophils Relative %: 64 %
Platelets: 436 10*3/uL — ABNORMAL HIGH (ref 150–400)
RBC: 4.15 MIL/uL — ABNORMAL LOW (ref 4.22–5.81)
RDW: 15.6 % — ABNORMAL HIGH (ref 11.5–15.5)
WBC: 17.4 10*3/uL — ABNORMAL HIGH (ref 4.0–10.5)
nRBC: 0.1 % (ref 0.0–0.2)

## 2018-08-05 LAB — RAPID URINE DRUG SCREEN, HOSP PERFORMED
Amphetamines: NOT DETECTED
Barbiturates: NOT DETECTED
Benzodiazepines: NOT DETECTED
Cocaine: NOT DETECTED
Opiates: NOT DETECTED
Tetrahydrocannabinol: POSITIVE — AB

## 2018-08-05 LAB — COMPREHENSIVE METABOLIC PANEL
ALT: 67 U/L — ABNORMAL HIGH (ref 0–44)
AST: 72 U/L — ABNORMAL HIGH (ref 15–41)
Albumin: 3.6 g/dL (ref 3.5–5.0)
Alkaline Phosphatase: 139 U/L — ABNORMAL HIGH (ref 38–126)
Anion gap: 11 (ref 5–15)
BUN: 10 mg/dL (ref 6–20)
CO2: 20 mmol/L — ABNORMAL LOW (ref 22–32)
Calcium: 9.1 mg/dL (ref 8.9–10.3)
Chloride: 100 mmol/L (ref 98–111)
Creatinine, Ser: 0.97 mg/dL (ref 0.61–1.24)
GFR calc Af Amer: >60 mL/min (ref >60)
GFR calc non Af Amer: >60 mL/min (ref >60)
Glucose, Bld: 328 mg/dL — ABNORMAL HIGH (ref 70–99)
Potassium: 4 mmol/L (ref 3.5–5.1)
Sodium: 131 mmol/L — ABNORMAL LOW (ref 135–145)
Total Bilirubin: 1 mg/dL (ref 0.3–1.2)
Total Protein: 6.4 g/dL — ABNORMAL LOW (ref 6.5–8.1)

## 2018-08-05 LAB — URINALYSIS, ROUTINE W REFLEX MICROSCOPIC
Bacteria, UA: NONE SEEN
Bilirubin Urine: NEGATIVE
Glucose, UA: >=500 mg/dL — AB
Hgb urine dipstick: NEGATIVE
Ketones, ur: NEGATIVE mg/dL
Leukocytes,Ua: NEGATIVE
Nitrite: NEGATIVE
Protein, ur: NEGATIVE mg/dL
Specific Gravity, Urine: 1.015 (ref 1.005–1.030)
pH: 6 (ref 5.0–8.0)

## 2018-08-05 LAB — ETHANOL: Alcohol, Ethyl (B): 41 mg/dL — ABNORMAL HIGH (ref <10)

## 2018-08-05 LAB — ACETAMINOPHEN LEVEL: Acetaminophen (Tylenol), Serum: <10 ug/mL — ABNORMAL LOW (ref 10–30)

## 2018-08-05 LAB — SALICYLATE LEVEL: Salicylate Lvl: <7 mg/dL (ref 2.8–30.0)

## 2018-08-05 LAB — CBG MONITORING, ED: Glucose-Capillary: 151 mg/dL — ABNORMAL HIGH (ref 70–99)

## 2018-08-05 MED ORDER — SODIUM CHLORIDE 0.9 % IV BOLUS
1000.0000 mL | Freq: Once | INTRAVENOUS | Status: AC
  Administered 2018-08-05: 1000 mL via INTRAVENOUS

## 2018-08-05 MED ORDER — SODIUM CHLORIDE 0.9 % IV BOLUS: 1000.0000 mL | Freq: Once | INTRAVENOUS | Status: DC

## 2018-08-05 NOTE — ED Provider Notes (Signed)
[facility] EMERGENCY DEPARTMENT Provider Note   CSN: [id number] Arrival date & time: 08/05/18  1629    History   Chief Complaint Chief Complaint  Patient presents with  . Heat Exposure    HPI Randall [name] is a 53 y.o. male.     53 y.o male with a PMH of CML, substance abused who presents to the ED via EMS with a chief complaint of "heat exhaustion" x today. Patient reports he has been drinking water, little bit of orange juice, MD 2020, hypnotic and two pints of fireball and some dr. Malachi Bonds. He is unsure how much he drank but states this has been ongoing all day. He reports having a syncopal episode, states he now has neck pain, back pain and myalgias. According to patient's records he was discharged yesterday and had a plan to follow up with [facility] to obtain an ID in order to travel to [place] where his family is located. He reports feeling a little bit down, no SI or plan, no HI, or hallucinations.      Past Medical History:  Diagnosis Date  . 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 Schizoaffective disorder, bipolar type ([place])   . Suicide attempt ([place]) 07/29/2018  . MDD (major depressive disorder) 07/29/2018  . 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 CML (chronic myelocytic leukemia) ([place]) 12/30/2011  . Cervical spondylosis without myelopathy 12/30/2011  . Myalgia and myositis, unspecified 12/30/2011    Past Surgical History:  Procedure Laterality Date  . 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 Authorizing Provider  benztropine (COGENTIN) 1 MG tablet Take 1 tablet (1 mg total) by mouth daily. 08/04/18 08/04/19  Johnn Hai, MD  diclofenac sodium (VOLTAREN) 1 % GEL Apply 2 g topically 4 (four) times daily. Patient not taking: Reported on 07/27/2018  06/21/12   Charlett Blake, MD  divalproex (DEPAKOTE) 500 MG DR tablet Take 3 tablets (1,500 mg total) by mouth at bedtime. 08/04/18   Johnn Hai, MD  haloperidol (HALDOL) 10 MG tablet 1 in am 2 at h s 08/04/18   Johnn Hai, MD  haloperidol decanoate (HALDOL DECANOATE) 100 MG/ML injection Inject 1.5 mLs (150 mg total) into the muscle once for 1 dose. Next due 7/3 08/04/18 08/04/18  Johnn Hai, MD  propranolol (INDERAL) 40 MG tablet Take 1 tablet (40 mg total) by mouth 2 (two) times daily. 08/04/18   Johnn Hai, MD    Family History History reviewed. No pertinent family history.  Social History Social History   Tobacco Use  . Smoking status: Current Every Day Smoker    Types: Cigarettes    Start date: 06/30/2011  . Smokeless tobacco: Never Used  Substance Use Topics  . Alcohol use: Not Currently  . Drug use: Yes    Types: Marijuana    Comment: patient is evasive in disclosing specifics concerning his drug use     Allergies   Acetaminophen; Morphine; Morphine and related; Tylenol [acetaminophen]; and Morphine and related   Review of Systems Review of Systems  Constitutional: Negative for chills and fever.  HENT: Negative for ear pain and sore throat.   Eyes: Negative for pain and visual disturbance.  Respiratory: Negative for cough and shortness of breath.   Cardiovascular: Negative for  chest pain and palpitations.  Gastrointestinal: Negative for abdominal pain, constipation, nausea and vomiting.  Genitourinary: Negative for dysuria and hematuria.  Musculoskeletal: Positive for back pain, myalgias and neck pain. Negative for arthralgias.  Skin: Negative for color change and rash.  Neurological: Positive for syncope. Negative for dizziness, seizures, light-headedness and headaches.  All other systems reviewed and are negative.    Physical Exam Updated Vital Signs BP 122/75   Pulse (!) 105   Temp 98.8 F (37.1 C) (Oral)   Resp (!) 21   Ht 5\' 7"  (1.702 m)   Wt 69.9 kg    SpO2 98%   BMI 24.12 kg/m   Physical Exam Vitals signs and nursing note reviewed.  Constitutional:      Appearance: He is well-developed.     Comments: Disheveled appearance.   HENT:     Head: Normocephalic and atraumatic.  Eyes:     General: No scleral icterus.    Pupils: Pupils are equal, round, and reactive to light.  Neck:     Musculoskeletal: Normal range of motion.  Cardiovascular:     Rate and Rhythm: Tachycardia present.     Heart sounds: Normal heart sounds.  Pulmonary:     Effort: Pulmonary effort is normal.     Breath sounds: Normal breath sounds. No wheezing.  Chest:     Chest wall: No tenderness.  Abdominal:     General: Bowel sounds are normal. There is no distension.     Palpations: Abdomen is soft.     Tenderness: There is no abdominal tenderness. There is no right CVA tenderness or left CVA tenderness.  Musculoskeletal:        General: No tenderness or deformity.  Skin:    General: Skin is warm and dry.  Neurological:     Mental Status: He is alert and oriented to person, place, and time.  Psychiatric:        Mood and Affect: Affect is blunt.        Cognition and Memory: Cognition is impaired. Memory is impaired.     Comments: Patient appears intoxicated.       ED Treatments / Results  Labs (all labs ordered are listed, but only abnormal results are displayed) Labs Reviewed  COMPREHENSIVE METABOLIC PANEL - Abnormal; Notable for the following components:      Result Value   Sodium 131 (*)    CO2 20 (*)    Glucose, Bld 328 (*)    Total Protein 6.4 (*)    AST 72 (*)    ALT 67 (*)    Alkaline Phosphatase 139 (*)    All other components within normal limits  ETHANOL - Abnormal; Notable for the following components:   Alcohol, Ethyl (B) 41 (*)    All other components within normal limits  RAPID URINE DRUG SCREEN, HOSP PERFORMED - Abnormal; Notable for the following components:   Tetrahydrocannabinol POSITIVE (*)    All other components within  normal limits  CBC WITH DIFFERENTIAL/PLATELET - Abnormal; Notable for the following components:   WBC 17.4 (*)    RBC 4.15 (*)    HCT 37.8 (*)    RDW 15.6 (*)    Platelets 436 (*)    Neutro Abs 11.3 (*)    Monocytes Absolute 2.0 (*)    Abs Immature Granulocytes 0.30 (*)    All other components within normal limits  ACETAMINOPHEN LEVEL - Abnormal; Notable for the following components:   Acetaminophen (Tylenol), Serum <10 (*)  All other components within normal limits  URINALYSIS, ROUTINE W REFLEX MICROSCOPIC - Abnormal; Notable for the following components:   Color, Urine STRAW (*)    Glucose, UA >=500 (*)    All other components within normal limits  CBG MONITORING, ED - Abnormal; Notable for the following components:   Glucose-Capillary 151 (*)    All other components within normal limits  SALICYLATE LEVEL    EKG None  Radiology Dg Chest 2 View  Result Date: 08/05/2018 CLINICAL DATA:  Chest pain, history CML EXAM: CHEST - 2 VIEW COMPARISON:  06/08/2018 FINDINGS: Normal heart size, mediastinal contours, and pulmonary vascularity. Lungs clear. No infiltrate, pleural effusion or pneumothorax. Bones unremarkable. IMPRESSION: Normal exam. Electronically Signed   By: Lavonia Dana M.D.   On: 08/05/2018 17:38    Procedures Procedures (including critical care time)  Medications Ordered in ED Medications  sodium chloride 0.9 % bolus 1,000 mL (has no administration in time range)  sodium chloride 0.9 % bolus 1,000 mL (0 mLs Intravenous Stopped 08/05/18 1819)     Initial Impression / Assessment and Plan / ED Course  I have reviewed the triage vital signs and the nursing notes.  Pertinent labs & imaging results that were available during my care of the patient were reviewed by me and considered in my medical decision making (see chart for details).  Clinical Course as of Aug 05 1947  Thu Aug 05, 2018  1822 ECG is sinus tachycardia nonspecific ST-T changes.   [MB]    Clinical  Course User Index [MB] Hayden Rasmussen, MD      Patient with a PMH of CML, substance abuse presents to the ED with a chief complaint of heat exhaustion. He reports drinking several amounts of alcohol throughout the day this included fireball, hypnotic, md 2020. He states he does not recall the events but fell backwards, reports pain along the neck, back, lumbar spine along with achiness.  He arrives in the ED with tachycardia with a heart rate around the 117, he appears disheveled, has all his belongings with him.  According to his medical records which have extensively review patient was discharged yesterday, last seen for polysubstance abuse, CML was getting behavioral treatment but was aggressive with staff prior to his discharge.  According to plan yesterday patient was to follow-up with [facility] in order to obtain an ID to buy a ticket to return to [place] where his family is located.  Patient reports he attempted to do that today but instead went and bought some alcohol. CMP showed slight decrease in sodium at 131, glucose level was 328, patient does not have a previous history of diabetes will rechecked CBG prior to providing patient with another liter.  Liver enzymes are slightly elevated but consistent with patient's previous visits.  CBC is markable for a white blood cell count of 17.4, UA showed glucose greater than 500, no ketones, no anion gap low suspicion for DKA.  Lipase level is within normal limits.  Acetaminophen level was less than 10, ethanol level was elevated at 41.  Chest x-ray was obtained which showed no acute process.  Patient's tachycardia seemed to improve after 1 L of fluids with a now heart rate of 105. His CBG will be rechecked prior to providing him with another liter of fluids.  At this time he denies SI, HI or hallucinations, no need for a TTS consultation at this time.  7:48 PM Patients CBG has been improved at 151, there is no  episodes of vomiting while in the ED,  patient is resting peacefully.  Will provide him with outpatient resources for shelters.  Patient understands and agrees with management at this time.  Patient with stable vital signs, tachycardia has resolved stable for discharge.  Portions of this note were generated with Lobbyist. Dictation errors may occur despite best attempts at proofreading.     Final Clinical Impressions(s) / ED Diagnoses   Final diagnoses:  Heat exhaustion, initial encounter  Homelessness    ED Discharge Orders    None       Janeece Fitting, Hershal Coria 08/05/18 1949    Hayden Rasmussen, MD 08/06/18 1052

## 2018-08-05 NOTE — Discharge Instructions (Addendum)
Your laboratory results were within normal limits today.  Please follow-up with the [facility] to obtain your identification card.

## 2018-08-05 NOTE — ED Notes (Signed)
Patient denies pain and is resting comfortably.  

## 2018-08-05 NOTE — ED Notes (Signed)
Patient transported to X-ray 

## 2018-08-05 NOTE — ED Triage Notes (Signed)
[facility] reports pt has been in sun and drinking since 8am with complaints of neck, back and head pain. Given 1000 mg of tylenol and 500 ml NS.  140/90 96% RA cbg 412 ST 130

## 2018-08-05 NOTE — ED Notes (Signed)
Provided pt with water.

## 2018-08-30 DIAGNOSIS — C9210 Chronic myeloid leukemia, BCR/ABL-positive, not having achieved remission: Secondary | ICD-10-CM

## 2019-01-07 ENCOUNTER — Other Ambulatory Visit: Payer: Self-pay

## 2019-01-07 ENCOUNTER — Inpatient Hospital Stay
Admission: EM | Admit: 2019-01-07 | Discharge: 2019-01-13 | DRG: 885 | Disposition: A | Discharge status: Home / Self Care | Payer: Medicare Other | Source: Intra-hospital | Attending: Psychiatry | Admitting: Psychiatry

## 2019-01-07 DIAGNOSIS — C9210 Chronic myeloid leukemia, BCR/ABL-positive, not having achieved remission: Secondary | ICD-10-CM | POA: Diagnosis present

## 2019-01-07 DIAGNOSIS — F15150 Other stimulant abuse with stimulant-induced psychotic disorder with delusions: Secondary | ICD-10-CM | POA: Diagnosis present

## 2019-01-07 DIAGNOSIS — F1510 Other stimulant abuse, uncomplicated: Secondary | ICD-10-CM | POA: Diagnosis present

## 2019-01-07 DIAGNOSIS — F250 Schizoaffective disorder, bipolar type: Secondary | ICD-10-CM | POA: Diagnosis present

## 2019-01-07 DIAGNOSIS — Z23 Encounter for immunization: Secondary | ICD-10-CM | POA: Diagnosis present

## 2019-01-07 DIAGNOSIS — R45851 Suicidal ideations: Secondary | ICD-10-CM | POA: Diagnosis present

## 2019-01-07 DIAGNOSIS — F22 Delusional disorders: Secondary | ICD-10-CM | POA: Diagnosis present

## 2019-01-07 DIAGNOSIS — F17210 Nicotine dependence, cigarettes, uncomplicated: Secondary | ICD-10-CM | POA: Diagnosis present

## 2019-01-07 DIAGNOSIS — F15950 Other stimulant use, unspecified with stimulant-induced psychotic disorder with delusions: Secondary | ICD-10-CM

## 2019-01-07 DIAGNOSIS — F322 Major depressive disorder, single episode, severe without psychotic features: Secondary | ICD-10-CM | POA: Diagnosis present

## 2019-01-07 MED ORDER — ALUM & MAG HYDROXIDE-SIMETH 200-200-20 MG/5ML PO SUSP: 30.0000 mL | ORAL | Status: DC | PRN

## 2019-01-07 MED ORDER — INFLUENZA VAC SPLIT QUAD 0.5 ML IM SUSY
0.5000 mL | PREFILLED_SYRINGE | INTRAMUSCULAR | Status: AC
  Administered 2019-01-08: 0.5 mL via INTRAMUSCULAR
  Filled 2019-01-07: qty 0.5

## 2019-01-07 MED ORDER — IBUPROFEN 200 MG PO TABS
400.0000 mg | ORAL_TABLET | Freq: Four times a day (QID) | ORAL | Status: DC | PRN
  Administered 2019-01-08 – 2019-01-13 (×4): 400 mg via ORAL
  Filled 2019-01-07 (×4): qty 2

## 2019-01-07 MED ORDER — HYDROXYZINE HCL 25 MG PO TABS: 25.0000 mg | ORAL_TABLET | Freq: Three times a day (TID) | ORAL | Status: DC | PRN

## 2019-01-07 MED ORDER — MAGNESIUM HYDROXIDE 400 MG/5ML PO SUSP: 30.0000 mL | Freq: Every day | ORAL | Status: DC | PRN

## 2019-01-07 MED ORDER — TRAZODONE HCL 50 MG PO TABS
50.0000 mg | ORAL_TABLET | Freq: Every evening | ORAL | Status: DC | PRN
  Filled 2019-01-07: qty 1

## 2019-01-07 NOTE — BH Assessment (Signed)
Patient has been accepted to [facility].  Accepting physician is Dr. Isidoro Donning.  Attending Physician will be Dr. Weber Cooks.  Patient has been assigned to room 323, by [place].  Call report to [phone].  Representative/Transfer Coordinator is Dispensing optician Patient pre-admitted by [facility] Patient Access ([place])  [facility] Staff Wynelle Bourgeois & Ava S., TTS) made aware of acceptance.

## 2019-01-07 NOTE — BH Assessment (Signed)
Information provided by [place] written by [place], [place] Initial Assessment  Patient Name: Randall [name], Randall [name] [facility]  Medical Record Number: [id number] Date of Birth: 07/10/1965  Patient Status: Observation Attending Provider: Linford Arnold  Account Number: [id number] Date: 01/05/19 00:53  Initialization Date: 01/05/19 00:53   - Patient Information Time Notified of Requested Telepsych Service: 21:14 Assessment unable to be completed: No (Pt able to be assessed.) Date of Service: 01/05/19 Time of Service: 00:55 Allergies/Adverse Reactions:  Allergies Allergy/AdvReac Type Severity Reaction Status Date/Time  acetaminophen [From Tylenol] Allergy  See Comments Verified 01/04/19 20:00  morphine Allergy  Nausea/Vomiting     Home Medications:  Home Medications  No Home Medications 06/09/18 [Confirmed 01/04/19 Last Taken Unknown]  Living Arrangement: Alone (Lives alone in [place].) Involuntary Commitment During Stay: No To the best of the elvaluator's knowledge, Patient is capable of signing voluntary admission: Yes Medicaid eligible on Admission: Yes  - HPI/DSM Symptoms/History Chief Complaint (why are you here?):  Patient called EMS and they brought him over [facility].  He says that he had pain.  He says that he was supposed to be seeing his daughter.  He talks about the government causing him pain and suffering.  He said that there is "no government, we are all free."  He says the government is trying to keep him quiet.  He says that the government has been torturing him in [place] because of things he knows.   He wanted to kill all mankind and keep the womenfolk.  He talks about killing himself with all of mankind.    Patient does not admit to having ingested amphetamines.  He admits to using marijuana "as much as I can."  Patient does not have medication that he takes on a regular basis.  Patient  will not answer questions directly.  He talks about being the son of the virgin [place].  he then talks about babies being killed, etc.  Medication Compliance: No (Pt does not admit to taking any medications.) Reason for seeking treatment: 911 Call (Pt called 911 himself.) Presented With: Reports: Bizarre Behavior, Auditory Hallucinations Recent stressful life event/ illness: Reports: loss of relationship (Reportedly has a restraining order against him.) Appearance: Casual, Stated Age Attitude: Guarded, Bizarre Mood: Calm Affect: Congruent w/ mood Insight: Impaired Judgement: Impaired Memory Description: Reports: Recent Intact (Pt reports memory to be good but his psychosis leaves that in doubt.) Depressive Symptoms: Reports: Poor Concentration, Isolating, Self-pity Anxiety Symptoms: Reports: Obsessive Thoughts Delusion Description: Reports: Bizarre, Persecutory (Government is torturing him about "what he knows.") Risk for physical violence towards others: Reports: Low risk Homicidal Ideation: No (Pt has idea of killing all men.) History of Violence/Aggression: Pt says he has been in fights in the past. Does patient have access to weapons?: No (Pt denies.  ) Criminal charges pending: No Court Date (if yes when): No Hallucination Type: Reports: Auditory (Pt denies but appears to be responding to internal stimuli.) Behavioral Stressors: Reports: Family (Reportedly not being able to see his daughter.) History of: Reports: Schizophrenia History of Abuse: Yes: Hx Substance Use Disorder Hx Substance Use Treatment: UNKNOWN (Pt evasive to questions.) Able to Care for Self: Yes Able to Control Self: No (Pt reportedly has a restraining order.)  - Medical History Cardiac History: Reports: Hypercholesterolemia GI/GU History: Reports: Gastroesophageal Reflux Psychological History: Reports: Anxiety, Bipolar Disorder, Substance Use Disorder.  Denies: Depression Respiratory History: Reports:  Cough Systemic History: Reports: Cancer (CML) Social History: Reports: Tobacco  Use in the Last 30 Days, Substance Use Disorder Surgical History: Reports: Tonsillectomy/Adnoidectomy, Other (SPLENECTOMY)  - Legal History Legal History:  Pt says he has no charges against him.  He has been incarcerated for fighting in the past.  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  - Disposition and Plan Diagnosis - Patient Problems:  Current Active Problems  Bipolar 1 disorder (Acute) F31.9 Bipolar disorder (Acute) F31.9 Substance abuse (Acute) F19.10   Case discussed with Hospital Provider: Westley Foots, PA Does patient meet inpatient criteria for hospital admission?: Yes Does the patient meet criteria for Involuntary Commitment?: N/A (If patient attempts to elope, EDP should consider IVC proceedings.) Recommend /or Refer: Inpatient Therapy (Pt meets inpatient care criteria.) Action/Disposition Plan:  -Clinician discussed patient care with Talbot Grumbling, NP ([facility]).  She recommended inpatient psychiatric care.  Pt is unable to tell delusions from reality and it is impacting his daily life.  Clinician discussed patient with Westley Foots, PA [facility] ED).  He clarified that patient had shown up at the residence of is daughter unannounced.  He has a restraining order on him.  The police were called then EMS arrived and patient came to [facility] via EMS.  Clinician informed Corene Cornea that patient was recommended for inpatient psychiatric care.  TTS to seek placement.  Information provided by [place] written by [place], Curlene Dolphin

## 2019-01-07 NOTE — Progress Notes (Signed)
Patient presents to unit psychotic and delusional. Patient stating he is living underground and that Software engineer trump put bomb on him and that Trump is the anti-christ. Patient denies SI, HI, AVH at present, feeling heartbroken because he is not able to see his daughter. Patient reports does not know if his childs mother is in heaven or not. Patient grandios at times. Oriented patient to room and unit. Skin and contraband search completed and witnessed by GIGI, RN. Patient has no skin issues other that tattoos over most of body, no contraband found.. Food and fluids offered. Safety checks maintained. Pt remains safe on unit with q 15 min checks.

## 2019-01-07 NOTE — Tx Team (Signed)
Initial Treatment Plan 01/07/2019 6:04 PM Randall [name] Randall [name] [id number]    PATIENT STRESSORS: Financial difficulties Marital or family conflict Substance abuse   PATIENT STRENGTHS: Capable of independent living General fund of knowledge   PATIENT IDENTIFIED PROBLEMS: Psychosis  Suicidal Ideation  Homicidal Ideation                 DISCHARGE CRITERIA:  Improved stabilization in mood, thinking, and/or behavior  PRELIMINARY DISCHARGE PLAN: Outpatient therapy  PATIENT/FAMILY INVOLVEMENT: This treatment plan has been presented to and reviewed with the patient, Randall [name], and/or family member, .  The patient and family have been given the opportunity to ask questions and make suggestions.  Floyde Parkins, RN 01/07/2019, 6:04 PM

## 2019-01-08 DIAGNOSIS — F250 Schizoaffective disorder, bipolar type: Principal | ICD-10-CM

## 2019-01-08 LAB — TSH: TSH: 0.972 u[IU]/mL (ref 0.350–4.500)

## 2019-01-08 MED ORDER — OLANZAPINE 5 MG PO TBDP
15.0000 mg | ORAL_TABLET | Freq: Two times a day (BID) | ORAL | Status: DC
  Administered 2019-01-08 – 2019-01-12 (×8): 15 mg via ORAL
  Filled 2019-01-08 (×8): qty 3

## 2019-01-08 NOTE — Tx Team (Signed)
Interdisciplinary Treatment and Diagnostic Plan Update  01/08/2019 Time of Session: 9:30AM Marz Janice MRN: [id number]  Principal Diagnosis: <principal problem not specified>  Secondary Diagnoses: Active Problems:   MDD (major depressive disorder), severe (HCC)   Current Medications:  Current Facility-Administered Medications  Medication Dose Route Frequency Provider Last Rate Last Dose  . alum & mag hydroxide-simeth (MAALOX/MYLANTA) 200-200-20 MG/5ML suspension 30 mL  30 mL Oral Q4H PRN Money, Lowry Ram, FNP      . hydrOXYzine (ATARAX/VISTARIL) tablet 25 mg  25 mg Oral TID PRN Money, Lowry Ram, FNP      . ibuprofen (ADVIL) tablet 400 mg  400 mg Oral Q6H PRN Money, Lowry Ram, FNP      . magnesium hydroxide (MILK OF MAGNESIA) suspension 30 mL  30 mL Oral Daily PRN Money, Lowry Ram, FNP      . traZODone (DESYREL) tablet 50 mg  50 mg Oral QHS PRN Money, Lowry Ram, FNP       PTA Medications: Medications Prior to Admission  Medication Sig Dispense Refill Last Dose  . benztropine (COGENTIN) 1 MG tablet Take 1 tablet (1 mg total) by mouth daily. 60 tablet 2   . diclofenac sodium (VOLTAREN) 1 % GEL Apply 2 g topically 4 (four) times daily. (Patient not taking: Reported on 07/27/2018) 3 Tube 1   . divalproex (DEPAKOTE) 500 MG DR tablet Take 3 tablets (1,500 mg total) by mouth at bedtime. 90 tablet 1   . haloperidol (HALDOL) 10 MG tablet 1 in am 2 at h s 90 tablet 2   . haloperidol decanoate (HALDOL DECANOATE) 100 MG/ML injection Inject 1.5 mLs (150 mg total) into the muscle once for 1 dose. Next due 7/3 1.5 mL 11   . propranolol (INDERAL) 40 MG tablet Take 1 tablet (40 mg total) by mouth 2 (two) times daily. 60 tablet 3     Patient Stressors: Financial difficulties Marital or family conflict Substance abuse  Patient Strengths: Capable of independent living General fund of knowledge  Treatment Modalities: Medication Management, Group therapy, Case management,  1 to 1 session with  clinician, Psychoeducation, Recreational therapy.   Physician Treatment Plan for Primary Diagnosis: <principal problem not specified> Long Term Goal(s):     Short Term Goals:    Medication Management: Evaluate patient's response, side effects, and tolerance of medication regimen.  Therapeutic Interventions: 1 to 1 sessions, Unit Group sessions and Medication administration.  Evaluation of Outcomes: Progressing  Physician Treatment Plan for Secondary Diagnosis: Active Problems:   MDD (major depressive disorder), severe ([place])  Long Term Goal(s):     Short Term Goals:       Medication Management: Evaluate patient's response, side effects, and tolerance of medication regimen.  Therapeutic Interventions: 1 to 1 sessions, Unit Group sessions and Medication administration.  Evaluation of Outcomes: Progressing   RN Treatment Plan for Primary Diagnosis: <principal problem not specified> Long Term Goal(s): Knowledge of disease and therapeutic regimen to maintain health will improve  Short Term Goals: Ability to remain free from injury will improve, Ability to verbalize frustration and anger appropriately will improve, Ability to demonstrate self-control, Ability to participate in decision making will improve, Ability to verbalize feelings will improve, Ability to disclose and discuss suicidal ideas, Ability to identify and develop effective coping behaviors will improve and Compliance with prescribed medications will improve  Medication Management: RN will administer medications as ordered by provider, will assess and evaluate patient's response and provide education to patient for prescribed medication. RN will  report any adverse and/or side effects to prescribing provider.  Therapeutic Interventions: 1 on 1 counseling sessions, Psychoeducation, Medication administration, Evaluate responses to treatment, Monitor vital signs and CBGs as ordered, Perform/monitor CIWA, COWS, AIMS and Fall Risk  screenings as ordered, Perform wound care treatments as ordered.  Evaluation of Outcomes: Progressing   LCSW Treatment Plan for Primary Diagnosis: <principal problem not specified> Long Term Goal(s): Safe transition to appropriate next level of care at discharge, Engage patient in therapeutic group addressing interpersonal concerns.  Short Term Goals: Engage patient in aftercare planning with referrals and resources, Increase social support, Increase ability to appropriately verbalize feelings, Increase emotional regulation, Facilitate acceptance of mental health diagnosis and concerns, Facilitate patient progression through stages of change regarding substance use diagnoses and concerns, Identify triggers associated with mental health/substance abuse issues and Increase skills for wellness and recovery  Therapeutic Interventions: Assess for all discharge needs, 1 to 1 time with Social worker, Explore available resources and support systems, Assess for adequacy in community support network, Educate family and significant other(s) on suicide prevention, Complete Psychosocial Assessment, Interpersonal group therapy.  Evaluation of Outcomes: Progressing   Progress in Treatment: Attending groups: Yes. Participating in groups: Yes. Taking medication as prescribed: Yes. Toleration medication: Yes. Family/Significant other contact made: No, will contact:  Patient will provide consent. Patient understands diagnosis: Yes. Discussing patient identified problems/goals with staff: Yes. Medical problems stabilized or resolved: Yes. Denies suicidal/homicidal ideation: Patient able to contract for safety on unit. Issues/concerns per patient self-inventory: No. Other: NA  New problem(s) identified: No, Describe:  None  New Short Term/Long Term Goal(s):  Engage patient in aftercare planning with referrals and resources, Increase social support, Increase skills for wellness and recovery  Patient Goals:   "to learn more bout myself and to give  A lot more"  Discharge Plan or Barriers: Patient to return home and participate in outpatient services.  Reason for Continuation of Hospitalization: Delusions   Estimated Length of Stay:  3-5 days  Attendees: Patient:  Randall [name] 01/08/2019 11:50 AM  Physician: Dr. Weber Cooks 01/08/2019 11:50 AM  Nursing: Polly Cobia, RN 01/08/2019 11:50 AM  RN Care Manager: 01/08/2019 11:50 AM  Social Worker: Netta Neat, LCSW 01/08/2019 11:50 AM  Recreational Therapist:  01/08/2019 11:50 AM  Other:  01/08/2019 11:50 AM  Other:  01/08/2019 11:50 AM  Other: 01/08/2019 11:50 AM    Scribe for Treatment Team: Netta Neat, MSW, LCSW Clinical Social Work 01/08/2019 11:50 AM

## 2019-01-08 NOTE — [facility] Group Notes (Signed)
LCSW Group Therapy 01/08/2019 1:00pm  Type of Therapy and Topic:  Group Therapy:  Setting Goals  Participation Level:  Active  Description of Group: In this process group, patients discussed using strengths to work toward goals and address challenges.  Patients identified two positive things about themselves and one goal they were working on.  Patients were given the opportunity to share openly and support each other's plan for self-empowerment.  The group discussed the value of gratitude and were encouraged to have a daily reflection of positive characteristics or circumstances.  Patients were encouraged to identify a plan to utilize their strengths to work on current challenges and goals.  Therapeutic Goals 1. Patient will verbalize personal strengths/positive qualities and relate how these can assist with achieving desired personal goals 2. Patients will verbalize affirmation of peers plans for personal change and goal setting 3. Patients will explore the value of gratitude and positive focus as related to successful achievement of goals 4. Patients will verbalize a plan for regular reinforcement of personal positive qualities and circumstances.  Summary of Patient Progress: Patient identified the definition of goals.Patients was given the opportunity to share openly and support other group members' plan for self-empowerment. Patient verbalized personal strength and how they relate to achieving the desired goal. Patient was able to identify positive goals to work towards when she returns home. Patient participated in group discussion. During check-ins, patient identified feeling "delicious and feeling sweet. I feel content ad pleasurable." Patient participated in discussion of SMART goals and identified goal he wanted to set. Patients were given a SMART goal worksheet for them to write down their goals and verify that their goal is SMART.    Therapeutic Modalities Cognitive Behavioral  Therapy Motivational Interviewing    Netta Neat, [place]

## 2019-01-08 NOTE — Progress Notes (Signed)
Patient alert and oriented x 4, affect is flat but he brightens upon approach , no distress noted, interacting appropriately with peers and staff, he denies SI/HI/AVH, thoughts are organized and coherent , 15 minutes safety checks maintained will continue to monitor.

## 2019-01-08 NOTE — Plan of Care (Signed)
Data: Patient is appropriate and cooperative to assessment. Patient denies SI/HI and denies AVH. Patient has completed daily self inventory worksheet. Patient reports good sleep quality, appetite is good. Patient rates depression "5/10" , feelings of hopelessness "1/10" and anxiety "1/10" Patients goal for today is "self."  Action:  Q x 15 minute observation checks were completed for safety. Patient was provided with education on medications. Patient was offered support and encouragement. Patient was given scheduled medications. Patient  was encourage to attend groups, participate in unit activities and continue with plan of care.     Response: Patient has no complaints at this time. Patient is receptive to treatment and safety maintained on unit.    Problem: Education: Goal: Knowledge of [facility] Education information/materials will improve Outcome: Not Progressing Goal: Emotional status will improve Outcome: Not Progressing Goal: Mental status will improve Outcome: Not Progressing Goal: Verbalization of understanding the information provided will improve Outcome: Not Progressing

## 2019-01-08 NOTE — H&P (Signed)
Psychiatric Admission Assessment Adult  Patient Identification: Randall [name] MRN:  [id number] Date of Evaluation:  01/08/2019 Chief Complaint:  Major Depression Principal Diagnosis: Schizoaffective disorder, bipolar type ([place]) Diagnosis:  Principal Problem:   Schizoaffective disorder, bipolar type ([place]) Active Problems:   CML (chronic myelocytic leukemia) ([place])   Amphetamine abuse ([place])   Amphetamine and psychostimulant-induced psychosis with delusions ([place])  History of Present Illness: Patient seen and chart reviewed.  This is a patient with a history of substance abuse and mental illness who was transferred to [place] from [facility].  He presented there on the third brought in by EMS.  Apparently he was acting and talking bizarrely when he came into the hospital in [place].  They were able to get labs on him and had a drug screen positive for amphetamine and marijuana.  On evaluation today the patient was initially cooperative politely came to sit down in the room and looked like he was going to engage in a reasonable conversation but immediately began answering all questions with complete nonsense.  His thoughts were disorganized and bizarre.  Nevertheless there also seem to be a bit of a theatrical theme to it and he tended to repeat himself on stereotyped "delusions" like talking about the "illuminati".  It was impossible to reroute him to any kind of rational bowl or reliable conversation.  He did tell me that he had been using methamphetamine and marijuana up until 4 days ago.  Denied alcohol use.  Denied being on any recent psychiatric medicine or following up with anyone outside the hospital.  He tells me that he has had no place to live and just been on the streets and [place].  According to the notes from [facility] he had tried to visit one of his children but there is a restraining order against him which is what prompted the authorities to bring him to the hospital.   Patient tells me that he is here because "I wanted to kill".  He presents this in a completely nonchalant manner.  When I ask him to elaborate he goes off on nonsensical tangents.  Does not show any signs of actually being angry at anyone or wanting to kill anybody.  Mention suicidal ideation but at the same time does not seem to be sad depressed or hopeless at all. Associated Signs/Symptoms: Depression Symptoms:  difficulty concentrating, suicidal thoughts without plan, (Hypo) Manic Symptoms:  Delusions, Distractibility, Elevated Mood, Grandiosity, Impulsivity, Irritable Mood, Labiality of Mood, Anxiety Symptoms:  Nothing reported specific Psychotic Symptoms:  Delusions, Ideas of Reference, Paranoia, PTSD Symptoms: Negative Total Time spent with patient: 1 hour  Past Psychiatric History: Patient was admitted to the hospital in [facility] in May and June of this year.  Sounds like it was a very similar hospitalization.  The conclusion at that time was that his symptoms were largely the result of methamphetamine abuse.  There was also a suggestion that he was probably exaggerating or malingering for secondary gain.  He was treated with antipsychotics and discharged with a referral to outpatient care.  Patient in conversation with me he acts like he does not remember anything about it and denies being in any kind of treatment recently.  He has had another psychiatric hospitalization in the last couple years with similar presentation, I think at [facility].  Despite this there are medical notes from only couple years earlier that do not mention any kind of mental health or behavior problems at all.  No known suicide attempts  no known actual violence.  Is the patient at risk to self? Yes.    Has the patient been a risk to self in the past 6 months? Yes.    Has the patient been a risk to self within the distant past? No.  Is the patient a risk to others? Yes.    Has the patient been a risk to others in  the past 6 months? Yes.    Has the patient been a risk to others within the distant past? No.   Prior Inpatient Therapy:   Prior Outpatient Therapy:    Alcohol Screening: 1. How often do you have a drink containing alcohol?: Monthly or less 2. How many drinks containing alcohol do you have on a typical day when you are drinking?: 1 or 2 3. How often do you have six or more drinks on one occasion?: Never AUDIT-C Score: 1 4. How often during the last year have you found that you were not able to stop drinking once you had started?: Never 5. How often during the last year have you failed to do what was normally expected from you becasue of drinking?: Never 6. How often during the last year have you needed a first drink in the morning to get yourself going after a heavy drinking session?: Never 7. How often during the last year have you had a feeling of guilt of remorse after drinking?: Never 8. How often during the last year have you been unable to remember what happened the night before because you had been drinking?: Never 9. Have you or someone else been injured as a result of your drinking?: No 10. Has a relative or friend or a doctor or another health worker been concerned about your drinking or suggested you cut down?: Yes, but not in the last year Alcohol Use Disorder Identification Test Final Score (AUDIT): 3 Alcohol Brief Interventions/Follow-up: AUDIT Score <7 follow-up not indicated Substance Abuse History in the last 12 months:  Yes.   Consequences of Substance Abuse: Medical Consequences:  The amphetamines certainly seem to be contributing to his thinking and behavior.  Additionally he seems to be ignoring his chronic leukemia. Previous Psychotropic Medications: Yes  Psychological Evaluations: Yes  Past Medical History:  Past Medical History:  Diagnosis Date  . CML (chronic myelocytic leukemia) ([place]) 2008    Past Surgical History:  Procedure Laterality Date  . 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 No pertinent family history. Family Psychiatric  History: None known Tobacco Screening: Have you used any form of tobacco in the last 30 days? (Cigarettes, Smokeless Tobacco, Cigars, and/or Pipes): No Social History:  Social History   Substance and Sexual Activity  Alcohol Use Not Currently     Social History   Substance and Sexual Activity  Drug Use Yes  . Types: Marijuana   Comment: patient is evasive in disclosing specifics concerning his drug use    Additional Social History:                           Allergies:   Allergies  Allergen Reactions  . Acetaminophen Nausea And Vomiting  . Morphine Nausea And Vomiting  . Morphine And Related Nausea And Vomiting    (Per patient's other chart in Epic- "Markjoseph Chee," MRN [id number])    . Tylenol [Acetaminophen] Nausea And Vomiting    (Per patient's other chart in Epic- "Hong Niewinski," MRN [id number])  . Morphine  And Related Nausea Only   Lab Results:  Results for orders placed or performed during the hospital encounter of 01/07/19 (from the past 48 hour(s))  TSH     Status: None   Collection Time: 01/08/19  6:49 AM  Result Value Ref Range   TSH 0.972 0.350 - 4.500 uIU/mL    Comment: Performed by a 3rd Generation assay with a functional sensitivity of <=0.01 uIU/mL. Performed at [facility], [address] [place], [place] [address]     Blood Alcohol level:  Lab Results  Component Value Date   ETH 41 (H) 08/05/2018   ETH <10 123XX123    Metabolic Disorder Labs:  Lab Results  Component Value Date   HGBA1C 6.1 (H) 07/30/2018   MPG 128.37 07/30/2018   No results found for: PROLACTIN Lab Results  Component Value Date   CHOL 184 07/30/2018   TRIG 144 07/30/2018   HDL 43 07/30/2018   CHOLHDL 4.3 07/30/2018   VLDL 29 07/30/2018   LDLCALC 112 (H) 07/30/2018    Current Medications: Current Facility-Administered Medications   Medication Dose Route Frequency Provider Last Rate Last Dose  . alum & mag hydroxide-simeth (MAALOX/MYLANTA) 200-200-20 MG/5ML suspension 30 mL  30 mL Oral Q4H PRN Money, Lowry Ram, FNP      . hydrOXYzine (ATARAX/VISTARIL) tablet 25 mg  25 mg Oral TID PRN Money, Lowry Ram, FNP      . ibuprofen (ADVIL) tablet 400 mg  400 mg Oral Q6H PRN Money, Darnelle Maffucci B, FNP      . magnesium hydroxide (MILK OF MAGNESIA) suspension 30 mL  30 mL Oral Daily PRN Money, Lowry Ram, FNP      . OLANZapine zydis (ZYPREXA) disintegrating tablet 15 mg  15 mg Oral BID Zekiel Torian T, MD      . traZODone (DESYREL) tablet 50 mg  50 mg Oral QHS PRN Money, Lowry Ram, FNP       PTA Medications: Medications Prior to Admission  Medication Sig Dispense Refill Last Dose  . benztropine (COGENTIN) 1 MG tablet Take 1 tablet (1 mg total) by mouth daily. 60 tablet 2   . diclofenac sodium (VOLTAREN) 1 % GEL Apply 2 g topically 4 (four) times daily. (Patient not taking: Reported on 07/27/2018) 3 Tube 1   . divalproex (DEPAKOTE) 500 MG DR tablet Take 3 tablets (1,500 mg total) by mouth at bedtime. 90 tablet 1   . haloperidol (HALDOL) 10 MG tablet 1 in am 2 at h s 90 tablet 2   . haloperidol decanoate (HALDOL DECANOATE) 100 MG/ML injection Inject 1.5 mLs (150 mg total) into the muscle once for 1 dose. Next due 7/3 1.5 mL 11   . propranolol (INDERAL) 40 MG tablet Take 1 tablet (40 mg total) by mouth 2 (two) times daily. 60 tablet 3     Musculoskeletal: Strength & Muscle Tone: within normal limits Gait & Station: normal Patient leans: N/A  Psychiatric Specialty Exam: Physical Exam  Nursing note and vitals reviewed. Constitutional: He appears well-developed and well-nourished.  HENT:  Head: Normocephalic and atraumatic.  Eyes: Pupils are equal, round, and reactive to light. Conjunctivae are normal.  Neck: Normal range of motion.  Cardiovascular: Regular rhythm and normal heart sounds.  Respiratory: Effort normal. No respiratory  distress.  GI: Soft.  Musculoskeletal: Normal range of motion.  Neurological: He is alert.  Skin: Skin is warm and dry.  Psychiatric: His affect is labile and inappropriate. His speech is rapid and/or pressured and tangential. He  is agitated. He is not aggressive. Thought content is delusional. Cognition and memory are impaired. He expresses inappropriate judgment. He expresses homicidal and suicidal ideation. He expresses no suicidal plans and no homicidal plans. He is noncommunicative.    Review of Systems  Constitutional: Negative.   HENT: Negative.   Eyes: Negative.   Respiratory: Negative.   Cardiovascular: Negative.   Gastrointestinal: Negative.   Musculoskeletal: Negative.   Skin: Negative.   Neurological: Negative.   Psychiatric/Behavioral: Positive for substance abuse and suicidal ideas. Negative for depression and hallucinations. The patient has insomnia. The patient is not nervous/anxious.     Blood pressure 116/86, pulse 78, temperature 97.7 F (36.5 C), temperature source Oral, resp. rate 18, height 5\' 7"  (1.702 m), weight 64.9 kg, SpO2 99 %.Body mass index is 22.4 kg/m.  General Appearance: Casual  Eye Contact:  Good  Speech:  Pressured  Volume:  Increased  Mood:  Euphoric  Affect:  Inappropriate and Labile  Thought Process:  Disorganized  Orientation:  Negative  Thought Content:  Illogical, Delusions, Paranoid Ideation, Rumination and Tangential  Suicidal Thoughts:  Yes.  without intent/plan  Homicidal Thoughts:  Yes.  without intent/plan  Memory:  Negative  Judgement:  Negative  Insight:  Negative  Psychomotor Activity:  Normal  Concentration:  Concentration: Poor  Recall:  Poor  Fund of Knowledge:  Fair  Language:  Fair  Akathisia:  No  Handed:  Right  AIMS (if indicated):     Assets:  Physical Health Resilience  ADL's:  Impaired  Cognition:  Impaired,  Mild  Sleep:  Number of Hours: 8.25    Treatment Plan Summary: Daily contact with patient to  assess and evaluate symptoms and progress in treatment, Medication management and Plan Patient with a past history of psychotic symptoms and substance abuse who is at least 4 days out from the last use of any drugs and still presenting with florid although somewhat dramatic and possibly overly theatrical psychotic symptoms.  He is not actually behaving in a way that would be consistent with the level of disorganization in his thought.  Generally he is actually taking care of himself okay and following routine orders.  He indicates to me that he does not want to take any medicine and when I suggested to him that if he did not take the oral Zyprexa and was still having symptoms tomorrow we would have to consider forced medicines his attitude changed and he cursed me a few times before ending the interview.  Continue 15-minute checks.  He seems unwilling to get labs but it looks like they did everything necessary really at [facility] [address]  Try to engage in groups and activities if he will show any meaningful or appropriate interaction.  Observation Level/Precautions:  15 minute checks  Laboratory:  UDS  Psychotherapy:    Medications:    Consultations:    Discharge Concerns:    Estimated LOS:  Other:     Physician Treatment Plan for Primary Diagnosis: Schizoaffective disorder, bipolar type ([place]) Long Term Goal(s): Improvement in symptoms so as ready for discharge  Short Term Goals: Ability to demonstrate self-control will improve and Compliance with prescribed medications will improve  Physician Treatment Plan for Secondary Diagnosis: Principal Problem:   Schizoaffective disorder, bipolar type ([place]) Active Problems:   CML (chronic myelocytic leukemia) ([place])   Amphetamine abuse ([place])   Amphetamine and psychostimulant-induced psychosis with delusions ([place])  Long Term Goal(s): Improvement in symptoms so as ready for discharge  Short Term Goals:  Ability to maintain clinical measurements within  normal limits will improve and Ability to identify triggers associated with substance abuse/mental health issues will improve  I certify that inpatient services furnished can reasonably be expected to improve the patient's condition.    Alethia Berthold, MD 11/7/20203:30 PM

## 2019-01-08 NOTE — BHH Suicide Risk Assessment (Signed)
[facility] Admission Suicide Risk Assessment   Nursing information obtained from:  Patient Demographic factors:  Divorced or widowed, Male, Unemployed Current Mental Status:  NA Loss Factors:  NA Historical Factors:  NA Risk Reduction Factors:  Living with another person, especially a relative  Total Time spent with patient: 1 hour Principal Problem: Schizoaffective disorder, bipolar type ([place]) Diagnosis:  Principal Problem:   Schizoaffective disorder, bipolar type ([place]) Active Problems:   CML (chronic myelocytic leukemia) ([facility])   Amphetamine abuse ([place])   Amphetamine and psychostimulant-induced psychosis with delusions ([place])  Subjective Data: Patient seen chart reviewed.  This is a patient with a past history of substance abuse and psychotic disorder who was transferred to [place] from [facility] because of gross psychosis agitation and suicidal and homicidal threats.  On interview today the patient tells me that he came to the hospital because "I wanted to kill".  Despite how dramatic this sounds the patient presents as playful jolly not angry in his appearance at all.  He has been generally calm and cooperative on the unit.  He does not appear to be in any way at all a reliable historian.  Patient admits that he had been using methamphetamine and marijuana recently.  Continued Clinical Symptoms:  Alcohol Use Disorder Identification Test Final Score (AUDIT): 3 The "Alcohol Use Disorders Identification Test", Guidelines for Use in Primary Care, Second Edition.  World Pharmacologist [facility]). Score between 0-7:  no or low risk or alcohol related problems. Score between 8-15:  moderate risk of alcohol related problems. Score between 16-19:  high risk of alcohol related problems. Score 20 or above:  warrants further diagnostic evaluation for alcohol dependence and treatment.   CLINICAL FACTORS:   Alcohol/Substance Abuse/Dependencies Schizophrenia:   Paranoid or undifferentiated  type   Musculoskeletal: Strength & Muscle Tone: within normal limits Gait & Station: normal Patient leans: N/A  Psychiatric Specialty Exam: Physical Exam  Nursing note and vitals reviewed. Constitutional: He appears well-developed and well-nourished.  HENT:  Head: Normocephalic and atraumatic.  Eyes: Pupils are equal, round, and reactive to light. Conjunctivae are normal.  Neck: Normal range of motion.  Cardiovascular: Regular rhythm and normal heart sounds.  Respiratory: Effort normal. No respiratory distress.  GI: Soft.  Musculoskeletal: Normal range of motion.  Neurological: He is alert.  Skin: Skin is warm and dry.  Psychiatric: His affect is labile and inappropriate. His speech is tangential. He is hyperactive. He is not agitated and not aggressive. Thought content is paranoid and delusional. Cognition and memory are impaired. He expresses impulsivity and inappropriate judgment. He is noncommunicative.    Review of Systems  Musculoskeletal: Positive for myalgias.  Psychiatric/Behavioral: Positive for suicidal ideas.    Blood pressure 116/86, pulse 78, temperature 97.7 F (36.5 C), temperature source Oral, resp. rate 18, height 5\' 7"  (1.702 m), weight 64.9 kg, SpO2 99 %.Body mass index is 22.4 kg/m.  General Appearance: Disheveled  Eye Contact:  Good  Speech:  Garbled and Pressured  Volume:  Increased  Mood:  Euphoric  Affect:  Inappropriate and Labile  Thought Process:  Disorganized  Orientation:  Other:  Unable to really assess  Thought Content:  Illogical, Delusions, Ideas of Reference:   Paranoia, Paranoid Ideation, Rumination and Tangential  Suicidal Thoughts:  Yes.  without intent/plan  Homicidal Thoughts:  Yes.  without intent/plan  Memory:  Negative  Judgement:  Negative  Insight:  Negative  Psychomotor Activity:  Normal  Concentration:  Concentration: Poor  Recall:  Poor  [employer]  of Knowledge:  Good  Language:  Good  Akathisia:  No  Handed:  Right  AIMS  (if indicated):     Assets:  Physical Health  ADL's:  Impaired  Cognition:  Impaired,  Mild  Sleep:  Number of Hours: 8.25      COGNITIVE FEATURES THAT CONTRIBUTE TO RISK:  Loss of executive function    SUICIDE RISK:   Minimal: No identifiable suicidal ideation.  Patients presenting with no risk factors but with morbid ruminations; may be classified as minimal risk based on the severity of the depressive symptoms  PLAN OF CARE: Patient admitted in transfer from [facility].  The patient is presenting as extremely disorganized with bizarre thinking to the point that he is unable to answer even a single question.  He has a past history of substance abuse and although there have been diagnoses of psychotic disorders in the past I would note that previous notes also strongly suggest the belief that there is a malingering component to this.  Nevertheless that he needs some antipsychotic medicine.  Explained this to him.  Ordered Zyprexa.  If he will not take it and he is still having psychotic symptoms like this tomorrow we will start forced medicines.  Continue 15-minute checks.  I certify that inpatient services furnished can reasonably be expected to improve the patient's condition.   Alethia Berthold, MD 01/08/2019, 3:24 PM

## 2019-01-08 NOTE — Plan of Care (Signed)
  Problem: Coping: Goal: Ability to verbalize frustrations and anger appropriately will improve Outcome: Progressing  Patient verbalized frustrations and anger to write during the shift.

## 2019-01-09 NOTE — BHH Group Notes (Signed)
LCSW Group Therapy Note 01/09/2019 1:15pm  Type of Therapy and Topic: Group Therapy: Feelings Around Returning Home & Establishing a Supportive Framework and Supporting Oneself When Supports Not Available  Participation Level: Did Not Attend  Description of Group:  Patients first processed thoughts and feelings about upcoming discharge. These included fears of upcoming changes, lack of change, new living environments, judgements and expectations from others and overall stigma of mental health issues. The group then discussed the definition of a supportive framework, what that looks and feels like, and how do to discern it from an unhealthy non-supportive network. The group identified different types of supports as well as what to do when your family/friends are less than helpful or unavailable  Therapeutic Goals  1. Patient will identify one healthy supportive network that they can use at discharge. 2. Patient will identify one factor of a supportive framework and how to tell it from an unhealthy network. 3. Patient able to identify one coping skill to use when they do not have positive supports from others. 4. Patient will demonstrate ability to communicate their needs through discussion and/or role plays.  Summary of Patient Progress:  Pt was invited to attend group but chose not to attend. CSW will continue to encourage pt to attend group throughout their admission.    Therapeutic Modalities Cognitive Behavioral Therapy Motivational Interviewing   Olvin Rohr  CUEBAS-COLON, LCSW 01/09/2019 11:11 AM

## 2019-01-09 NOTE — Plan of Care (Signed)
Patient is calm and stable and responding appropriate with out any physical complains , attends group with good participating , encouragement is provided , patient denies SI/HI/AVH  safety is maintained no distress.   Problem: Education: Goal: Knowledge of [place] [facility] Education information/materials will improve Outcome: Progressing Goal: Emotional status will improve Outcome: Progressing Goal: Mental status will improve Outcome: Progressing Goal: Verbalization of understanding the information provided will improve Outcome: Progressing   Problem: Activity: Goal: Interest or engagement in activities will improve Outcome: Progressing Goal: Sleeping patterns will improve Outcome: Progressing   Problem: Coping: Goal: Ability to verbalize frustrations and anger appropriately will improve Outcome: Progressing Goal: Ability to demonstrate self-control will improve Outcome: Progressing   Problem: Health Behavior/Discharge Planning: Goal: Identification of resources available to assist in meeting health care needs will improve Outcome: Progressing Goal: Compliance with treatment plan for underlying cause of condition will improve Outcome: Progressing   Problem: Safety: Goal: Periods of time without injury will increase Outcome: Progressing   Problem: Education: Goal: Will be free of psychotic symptoms Outcome: Progressing Goal: Knowledge of the prescribed therapeutic regimen will improve Outcome: Progressing   Problem: Coping: Goal: Coping ability will improve Outcome: Progressing Goal: Will verbalize feelings Outcome: Progressing   Problem: Role Relationship: Goal: Ability to interact with others will improve Outcome: Progressing   Problem: Safety: Goal: Ability to redirect hostility and anger into socially appropriate behaviors will improve Outcome: Progressing

## 2019-01-09 NOTE — Plan of Care (Signed)
Pleasant and cooperative. Hyper religious. Goal today is to "try not to get mad". Patient reported that he slept well last night. Denies thoughts of self harm.  Had breakfast and received medications. No concern so far. Encouragements provided. Safety monitored.

## 2019-01-09 NOTE — BHH Counselor (Signed)
Adult Comprehensive Assessment  Patient ID: Randall [name], male   DOB: Apr 25, 1965, 53 y.o.   MRN: [id number]  Information Source: Information source:  Patient   Current Stressors:  Patient states their primary concerns and needs for treatment are: "a broken heart" Patient states their goals for this hospitilization and ongoing recovery are: Get well and stable Family Relationships: Not good relationships with any family members - only calls them when he needs something Financial / Lack of resources (include bankruptcy): Has disability, very small income, controls it himself. Housing / Lack of housing: Homeless for almost 1 year and often gets kicked out of places due to his mental health issues, substance abuse, and anger. Physical health (include injuries & life threatening diseases): Has cancer (Stage 3 leukemia) currently, has not been taking his medicine.  Has had this since 2008, in remission at one point, got it again in 2018. Substance abuse: Tells sister he is a drug addict.  Was doing heroin "awhile back" and "takes pills because of the pain from the cancer."  He also was doing meth. Bereavement / Loss: Girlfriend and some other friends overdosed on heroin  Living/Environment/Situation:  Living Arrangements: Other (Comment) Living conditions (as described by patient or guardian): Homeless, living with a friend then with another friend, then a shelter and was kicked out of that. Who else lives in the home?: Nobody How long has patient lived in current situation?: Since getting out of jail in June 2019. What is atmosphere in current home: Chaotic, Dangerous  Family History:  Marital status: Divorced Are you sexually active?: (Unknown) What is your sexual orientation?: Straight Does patient have children?: Yes How many children?: 4 How is patient's relationship with their children?: Oldest - no relationship; 2 youngest - does not see them; 53yo - talks to them "here and  there"  Childhood History:  By whom was/is the patient raised?: Both parents Description of patient's relationship with caregiver when they were a child: Not good with either parent Patient's description of current relationship with people who raised him/her: Mother - does not talk to him much because of his life choices; Father - same, because he only calls when he needs help. How were you disciplined when you got in trouble as a child/adolescent?: (Not discussed) Does patient have siblings?: Yes Number of Siblings: 3 Description of patient's current relationship with siblings: 2 sisters, 1 brother - Freida Busman is the closest to him, none are in [place], all in [place] Did patient suffer any verbal/emotional/physical/sexual abuse as a child?: No Did patient suffer from severe childhood neglect?: No Has patient ever been sexually abused/assaulted/raped as an adolescent or adult?: No Was the patient ever a victim of a crime or a disaster?: No Witnessed domestic violence?: No Has patient been effected by domestic violence as an adult?: (Unknown)  Education:  Highest grade of school patient has completed: 11th grade Currently a student?: No Learning disability?: No  Employment/Work Situation:   Employment situation: On disability Why is patient on disability: Leukemia How long has patient been on disability: Off and on since 2008 Where was the patient employed at that time?: [employer] Did You Receive Any Psychiatric Treatment/Services While in the [employer]?: (No [employer]) Are There Guns or Other Weapons in [place]?: No  Financial Resources:   Museum/gallery curator resources: Armed forces training and education officer, Medicaid Does patient have a Programmer, applications or guardian?: No  Alcohol/Substance Abuse:   What has been your use of drugs/alcohol within the last 12 months?: Unknown  by sister.  He has told her he is trying to remain sober.  He has a history with heroin, methampethamines, and  "pills" for pain. 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  Has been hospitalized several times. Has alcohol/substance abuse ever caused legal problems?: (Has been in prison with drug-related charges.)  Social Support System:   Patient's Community Support System: Poor Describe Community Support System: Family in [place] Type of faith/religion: Born [place] How does patient's faith help to cope with current illness?: Unknown  Leisure/Recreation:    Strengths/Needs:    Discharge Plan:   Currently receiving community mental health services: No Patient states concerns and preferences for aftercare planning are: This will have to be addressed to patient when he is more stable. Does patient have access to transportation?: No Does patient have financial barriers related to discharge medications?: Yes Patient description of barriers related to discharge medications: Has disability income and Medicaid Plan for no access to transportation at discharge: Bus pass may be needed Will patient be returning to same living situation after discharge?: (Unknown)    Summary/Recommendations:   Summary and Recommendations (to be completed by the evaluator): Patient is a 53 year old male admitted involuntarily and diagnosed with Schizoaffective disorder, bipolar type.  This is a patient with a history of substance abuse and mental illness who was transferred to [facility] from [facility].  He presented there on the third brought in by EMS.  Patient was acting and talking bizarrely when he came into the hospital in [place].  They were able to get labs on him and had a drug screen positive for amphetamine and marijuana.  On evaluation today the patient was initially cooperative politely came to sit down in the room and looked like he was going to engage in a reasonable conversation but immediately began answering all questions  with complete nonsense.  His thoughts were disorganized and bizarre. Patient will benefit from crisis stabilization, medication evaluation, group therapy and psychoeducation. In addition to case management for discharge planning. At discharge it is recommended that patient adhere to the established discharge plan and continue treatment.  Danniell Rotundo  CUEBAS-COLON. 01/09/2019

## 2019-01-09 NOTE — Progress Notes (Signed)
[facility] MD Progress Note  01/09/2019 12:40 PM Randall [name]  MRN:  [id number] Subjective: Follow-up for this patient with schizoaffective disorder.  Came to see him this morning.  He was awake in bed.  He chose to be rather rude and uncooperative but did say that he was feeling "fine".  Denied any physical symptoms.  When I ask him more questions he started to respond with nonsensical bizarre statements.  I do see that he took his olanzapine when offered which is wonderful news.  Nurses report that he has been no trouble but continues to talk strangely usually in a jolly manner with them. Principal Problem: Schizoaffective disorder, bipolar type ([place]) Diagnosis: Principal Problem:   Schizoaffective disorder, bipolar type ([place]) Active Problems:   CML (chronic myelocytic leukemia) (HCC)   Amphetamine abuse (HCC)   Amphetamine and psychostimulant-induced psychosis with delusions ([place])  Total Time spent with patient: 30 minutes  Past Psychiatric History: Patient has a past history of similar episodes of this kind of presentation unclear how much of it is just amphetamines versus other mental illness  Past Medical History:  Past Medical History:  Diagnosis Date  . CML (chronic myelocytic leukemia) ([place]) 2008    Past Surgical History:  Procedure Laterality Date  . 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 No pertinent family history. Family Psychiatric  History: See previous Social History:  Social History   Substance and Sexual Activity  Alcohol Use Not Currently     Social History   Substance and Sexual Activity  Drug Use Yes  . Types: Marijuana   Comment: patient is evasive in disclosing specifics concerning his drug use    Social History   Socioeconomic History  . Marital status: Divorced    Spouse name: Not on file  . Number of children: Not on file  . Years of education: Not on file  . Highest education level: Not on file  Occupational History   . Not on file  Social Needs  . Financial resource strain: Not on file  . Food insecurity    Worry: Not on file    Inability: Not on file  . Transportation needs    Medical: Not on file    Non-medical: Not on file  Tobacco Use  . Smoking status: Current Every Day Smoker    Types: Cigarettes    Start date: 06/30/2011  . Smokeless tobacco: Never Used  Substance and Sexual Activity  . Alcohol use: Not Currently  . Drug use: Yes    Types: Marijuana    Comment: patient is evasive in disclosing specifics concerning his drug use  . Sexual activity: Not Currently  Lifestyle  . Physical activity    Days per week: Not on file    Minutes per session: Not on file  . Stress: Not on file  Relationships  . Social Herbalist on phone: Not on file    Gets together: Not on file    Attends religious service: Not on file    Active member of club or organization: Not on file    Attends meetings of clubs or organizations: Not on file    Relationship status: Not on file  Other Topics Concern  . Not on file  Social History Narrative   ** Merged History Encounter **       Additional Social History:  Sleep: Good  Appetite:  Good  Current Medications: Current Facility-Administered Medications  Medication Dose Route Frequency Provider Last Rate Last Dose  . alum & mag hydroxide-simeth (MAALOX/MYLANTA) 200-200-20 MG/5ML suspension 30 mL  30 mL Oral Q4H PRN Money, Lowry Ram, FNP      . hydrOXYzine (ATARAX/VISTARIL) tablet 25 mg  25 mg Oral TID PRN Money, Lowry Ram, FNP      . ibuprofen (ADVIL) tablet 400 mg  400 mg Oral Q6H PRN Money, Lowry Ram, FNP   400 mg at 01/08/19 1654  . magnesium hydroxide (MILK OF MAGNESIA) suspension 30 mL  30 mL Oral Daily PRN Money, Lowry Ram, FNP      . OLANZapine zydis (ZYPREXA) disintegrating tablet 15 mg  15 mg Oral BID Clapacs, Madie [place], MD   15 mg at 01/09/19 0914  . traZODone (DESYREL) tablet 50 mg  50 mg Oral QHS PRN  Money, Lowry Ram, FNP        Lab Results:  Results for orders placed or performed during the hospital encounter of 01/07/19 (from the past 48 hour(s))  TSH     Status: None   Collection Time: 01/08/19  6:49 AM  Result Value Ref Range   TSH 0.972 0.350 - 4.500 uIU/mL    Comment: Performed by a 3rd Generation assay with a functional sensitivity of <=0.01 uIU/mL. Performed at [facility], [address] [place], [place] [address]     Blood Alcohol level:  Lab Results  Component Value Date   ETH 41 (H) 08/05/2018   ETH <10 123XX123    Metabolic Disorder Labs: Lab Results  Component Value Date   HGBA1C 6.1 (H) 07/30/2018   MPG 128.37 07/30/2018   No results found for: PROLACTIN Lab Results  Component Value Date   CHOL 184 07/30/2018   TRIG 144 07/30/2018   HDL 43 07/30/2018   CHOLHDL 4.3 07/30/2018   VLDL 29 07/30/2018   LDLCALC 112 (H) 07/30/2018    Physical Findings: AIMS:  , ,  ,  ,    CIWA:    COWS:     Musculoskeletal: Strength & Muscle Tone: within normal limits Gait & Station: normal Patient leans: N/A  Psychiatric Specialty Exam: Physical Exam  Nursing note and vitals reviewed. Constitutional: He appears well-developed and well-nourished.  HENT:  Head: Normocephalic and atraumatic.  Eyes: Pupils are equal, round, and reactive to light. Conjunctivae are normal.  Neck: Normal range of motion.  Cardiovascular: Regular rhythm and normal heart sounds.  Respiratory: Effort normal. No respiratory distress.  GI: Soft.  Musculoskeletal: Normal range of motion.  Neurological: He is alert.  Skin: Skin is warm and dry.  Psychiatric: His affect is blunt and inappropriate. His speech is tangential. He is withdrawn. Thought content is delusional. Cognition and memory are impaired. He expresses impulsivity and inappropriate judgment.    Review of Systems  Constitutional: Negative.   HENT: Negative.   Eyes: Negative.   Respiratory: Negative.    Cardiovascular: Negative.   Gastrointestinal: Negative.   Musculoskeletal: Negative.   Skin: Negative.   Neurological: Negative.   Psychiatric/Behavioral: Positive for substance abuse.    Blood pressure (!) 139/92, pulse 66, temperature 97.8 F (36.6 C), temperature source Oral, resp. rate 18, height 5\' 7"  (1.702 m), weight 64.9 kg, SpO2 100 %.Body mass index is 22.4 kg/m.  General Appearance: Casual  Eye Contact:  Minimal  Speech:  Slow  Volume:  Decreased  Mood:  Euthymic  Affect:  Constricted  Thought Process:  Disorganized  Orientation:  Full (Time, Place, and Person)  Thought Content:  Illogical  Suicidal Thoughts:  No  Homicidal Thoughts:  No  Memory:  Immediate;   Fair Recent;   Poor Remote;   Poor  Judgement:  Impaired  Insight:  Lacking  Psychomotor Activity:  Decreased  Concentration:  Concentration: Poor  Recall:  Poor  Fund of Knowledge:  Poor  Language:  Poor  Akathisia:  No  Handed:  Right  AIMS (if indicated):     Assets:  Physical Health  ADL's:  Impaired  Cognition:  Impaired,  Mild  Sleep:  Number of Hours: 7.75     Treatment Plan Summary: Daily contact with patient to assess and evaluate symptoms and progress in treatment, Medication management and Plan Continue current olanzapine.  Offered patient please come and talk to [place] if there is anything he needs or any new concerns he has.  No change to the rest of the plan for now.  Encourage group attendance.  Alethia Berthold, MD 01/09/2019, 12:40 PM

## 2019-01-10 NOTE — BHH Suicide Risk Assessment (Signed)
[place] INPATIENT:  Family/Significant Other Suicide Prevention Education  Suicide Prevention Education:  Contact Attempts: JEAN-PAUL, KOSH [phone] ; 1st attempt 11/9 has been identified by the patient as the family member/significant other with whom the patient will be residing, and identified as the person(s) who will aid the patient in the event of a mental health crisis.  With written consent from the patient, two attempts were made to provide suicide prevention education, prior to and/or following the patient's discharge.  We were unsuccessful in providing suicide prevention education.  A suicide education pamphlet was given to the patient to share with family/significant other.  Date and time of first attempt:01/10/2019 at 11:44AM Date and time of second attempt:    CSW left a HIPAA compliant voicemail requesting a call back.   [place] MSW LCSW 01/10/2019, 11:44 AM

## 2019-01-10 NOTE — BHH Group Notes (Signed)
LCSW Group Therapy Note   01/10/2019 1:00 PM  Type of Therapy and Topic:  Group Therapy:  Overcoming Obstacles   Participation Level:  Did Not Attend   Description of Group:    In this group patients will be encouraged to explore what they see as obstacles to their own wellness and recovery. They will be guided to discuss their thoughts, feelings, and behaviors related to these obstacles. The group will process together ways to cope with barriers, with attention given to specific choices patients can make. Each patient will be challenged to identify changes they are motivated to make in order to overcome their obstacles. This group will be process-oriented, with patients participating in exploration of their own experiences as well as giving and receiving support and challenge from other group members.   Therapeutic Goals: 1. Patient will identify personal and current obstacles as they relate to admission. 2. Patient will identify barriers that currently interfere with their wellness or overcoming obstacles.  3. Patient will identify feelings, thought process and behaviors related to these barriers. 4. Patient will identify two changes they are willing to make to overcome these obstacles:      Summary of Patient Progress X   Therapeutic Modalities:   Cognitive Behavioral Therapy Solution Focused Therapy Motivational Interviewing Relapse Prevention Therapy  Assunta Curtis, MSW, LCSW 01/10/2019 12:38 PM

## 2019-01-10 NOTE — Progress Notes (Signed)
Recreation Therapy Notes  INPATIENT RECREATION THERAPY ASSESSMENT  Patient Details Name: Randall [name] MRN: [id number] DOB: [birth year] Today's Date: 01/10/2019       Information Obtained From: Patient  Able to Participate in Assessment/Interview: Yes  Patient Presentation: Responsive  Reason for Admission (Per Patient): Active Symptoms  Patient Stressors:    Coping Skills:   Other (Comment), Meditate, Talk(Sleep)  Leisure Interests (2+):  Music - Listen  Frequency of Recreation/Participation:    Awareness of Community Resources:     [employer]:     Current Use:    If no, Barriers?:    Expressed Interest in [place] of Residence:  Lobbyist  Patient Main Form of Transportation: Walk  Patient Strengths:  God blessed me  Patient Identified Areas of Improvement:  N/A  Patient Goal for Hospitalization:  To get out and do positive for the world  Current SI (including self-harm):  No  Current HI:  No  Current AVH: Yes(Hear voices)  Staff Intervention Plan: Collaborate with Interdisciplinary Treatment Team  Consent to Intern Participation: N/A  Lucien Budney 01/10/2019, 3:00 PM

## 2019-01-10 NOTE — Progress Notes (Signed)
Recreation Therapy Notes   Date: 01/10/2019  Time: 9:30 am   Location: Craft room   Behavioral response: N/A   Intervention Topic: Values  Discussion/Intervention: Patient did not attend group.   Clinical Observations/Feedback:  Patient did not attend group.   Amed Datta LRT/CTRS        Yvette Roark 01/10/2019 10:59 AM

## 2019-01-10 NOTE — Progress Notes (Signed)
[facility] MD Progress Note  01/10/2019 1:08 PM Randall [name]  MRN:  [id number]   Subjective:  This is a follow up with a patient with Schizoaffective disorder. Patient continues to have disorganized thought and bizarre comments. At first he has logical conversation about his racing thoughts calming down and that he slept better last night. He denies any suicidal or homicidal ideations and denies any hallucinations. When asked where he plans to live after discharge he states [place] and he plans to get there by kidnapping a Holiday representative and probably a couple people from the hospital so they will take him there. He denies any family or friends in [place]. He then starts talking about the illuminati and various religious comments and then entangles the recent election in to the comments.   Principal Problem: Schizoaffective disorder, bipolar type ([place]) Diagnosis: Principal Problem:   Schizoaffective disorder, bipolar type ([place]) Active Problems:   CML (chronic myelocytic leukemia) (HCC)   Amphetamine abuse (HCC)   Amphetamine and psychostimulant-induced psychosis with delusions ([place])  Total Time spent with patient: 20 minutes  Past Psychiatric History:  Patient has a past history of similar episodes of this kind of presentation unclear how much of it is just amphetamines versus other mental illness  Past Medical History:  Past Medical History:  Diagnosis Date  . CML (chronic myelocytic leukemia) ([place]) 2008    Past Surgical History:  Procedure Laterality Date  . 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 No pertinent family history. Family Psychiatric  History: None known Social History:  Social History   Substance and Sexual Activity  Alcohol Use Not Currently     Social History   Substance and Sexual Activity  Drug Use Yes  . Types: Marijuana   Comment: patient is evasive in disclosing specifics concerning his drug use    Social History   Socioeconomic  History  . Marital status: Divorced    Spouse name: Not on file  . Number of children: Not on file  . Years of education: Not on file  . Highest education level: Not on file  Occupational History  . Not on file  Social Needs  . Financial resource strain: Not on file  . Food insecurity    Worry: Not on file    Inability: Not on file  . Transportation needs    Medical: Not on file    Non-medical: Not on file  Tobacco Use  . Smoking status: Current Every Day Smoker    Types: Cigarettes    Start date: 06/30/2011  . Smokeless tobacco: Never Used  Substance and Sexual Activity  . Alcohol use: Not Currently  . Drug use: Yes    Types: Marijuana    Comment: patient is evasive in disclosing specifics concerning his drug use  . Sexual activity: Not Currently  Lifestyle  . Physical activity    Days per week: Not on file    Minutes per session: Not on file  . Stress: Not on file  Relationships  . Social Herbalist on phone: Not on file    Gets together: Not on file    Attends religious service: Not on file    Active member of club or organization: Not on file    Attends meetings of clubs or organizations: Not on file    Relationship status: Not on file  Other Topics Concern  . Not on file  Social History Narrative   ** Merged History Encounter **  Additional Social History:                         Sleep: Good  Appetite:  Good  Current Medications: Current Facility-Administered Medications  Medication Dose Route Frequency Provider Last Rate Last Dose  . alum & mag hydroxide-simeth (MAALOX/MYLANTA) 200-200-20 MG/5ML suspension 30 mL  30 mL Oral Q4H PRN Money, Lowry Ram, FNP      . hydrOXYzine (ATARAX/VISTARIL) tablet 25 mg  25 mg Oral TID PRN Money, Lowry Ram, FNP      . ibuprofen (ADVIL) tablet 400 mg  400 mg Oral Q6H PRN Money, Lowry Ram, FNP   400 mg at 01/10/19 0823  . magnesium hydroxide (MILK OF MAGNESIA) suspension 30 mL  30 mL Oral Daily PRN  Money, Lowry Ram, FNP      . OLANZapine zydis (ZYPREXA) disintegrating tablet 15 mg  15 mg Oral BID Clapacs, Madie Ren[place], MD   15 mg at 01/10/19 0823  . traZODone (DESYREL) tablet 50 mg  50 mg Oral QHS PRN Money, Lowry Ram, FNP        Lab Results: No results found for this or any previous visit (from the past 48 hour(s)).  Blood Alcohol level:  Lab Results  Component Value Date   ETH 41 (H) 08/05/2018   ETH <10 123XX123    Metabolic Disorder Labs: Lab Results  Component Value Date   HGBA1C 6.1 (H) 07/30/2018   MPG 128.37 07/30/2018   No results found for: PROLACTIN Lab Results  Component Value Date   CHOL 184 07/30/2018   TRIG 144 07/30/2018   HDL 43 07/30/2018   CHOLHDL 4.3 07/30/2018   VLDL 29 07/30/2018   LDLCALC 112 (H) 07/30/2018    Physical Findings: AIMS:  , ,  ,  ,    CIWA:    COWS:     Musculoskeletal: Strength & Muscle Tone: within normal limits Gait & Station: normal Patient leans: N/A  Psychiatric Specialty Exam: Physical Exam  Nursing note and vitals reviewed. Constitutional: He is oriented to person, place, and time. He appears well-developed and well-nourished.  Cardiovascular: Normal rate.  Respiratory: Effort normal.  Musculoskeletal: Normal range of motion.  Neurological: He is alert and oriented to person, place, and time.  Skin: Skin is warm.    ROS  Blood pressure (!) 139/93, pulse 84, temperature 98.2 F (36.8 C), temperature source Oral, resp. rate 17, height 5\' 7"  (1.702 m), weight 64.9 kg, SpO2 100 %.Body mass index is 22.4 kg/m.  General Appearance: Disheveled  Eye Contact:  Fair  Speech:  Clear and Coherent and Normal Rate  Volume:  Normal  Mood:  Euthymic  Affect:  Flat  Thought Process:  Disorganized, Irrelevant and Descriptions of Associations: Tangential  Orientation:  Full (Time, Place, and Person)  Thought Content:  Illogical and Paranoid Ideation  Suicidal Thoughts:  No  Homicidal Thoughts:  No  Memory:  Immediate;    Fair Recent;   Fair Remote;   Fair  Judgement:  Impaired  Insight:  Lacking  Psychomotor Activity:  Normal  Concentration:  Concentration: Poor  Recall:  [employer] of Knowledge:  Poor  Language:  Good  Akathisia:  No  Handed:  Right  AIMS (if indicated):     Assets:  Communication Skills Physical Health  ADL's:  Intact  Cognition:  WNL  Sleep:  Number of Hours: 7.75   Assessment: Patient presents in his bed but is awake.  Patient remains  calm and cooperative during the interview.  He has continued making bizarre and nonsensical comments.  Is been reported multiple times that at the beginning of his conversations they appear to be completely normal and logical however shortly after the patient starts adding in random bizarre comments that have no relation to the questions or comments that are being made.  Patient does continue to appear to be hyper religious and having some paranoid ideations related to his religious thoughts.  He did state that he was not suicidal homicidal but does not appear to be stable enough to be considering discharge anytime soon at this moment.  Treatment Plan Summary: Daily contact with patient to assess and evaluate symptoms and progress in treatment and Medication management Continue Vistaril 25 mg p.o. 3 times daily as needed for anxiety Continue Zyprexa Zydis 15 mg p.o. twice daily for schizoaffective disorder Continue trazodone 50 mg p.o. nightly as needed for insomnia Encourage group therapy participation Continue every 15 minute safety checks  Lewis Shock, FNP 01/10/2019, 1:08 PM

## 2019-01-10 NOTE — Progress Notes (Signed)
Pts sister left CSW a voicemail with pts daughter Ann's phone number. CSW passed the information to pt and obtained permission from pt to speak with his daughter.  CSW contacted Percival Spanish [phone] who reported that she wants to help pt out with finding stable housing and plans to speak with pt regarding his finances to see if she can find him low-income housing in the area. Ann reported that she plans to take pt to his appointments so he can stay up with his mental health treatment. Ann requested to be notified when there is a discharge date.   Evalina Field, MSW, LCSW Clinical Social Work 01/10/2019 2:10 PM

## 2019-01-10 NOTE — Plan of Care (Signed)
Continues to display disorganized thinking and behaviors. Delusional but pleasant and cooperative . Mostly in room but out for medications and meals. No sign of distress. Safety monitored as recommended.

## 2019-01-10 NOTE — B[facility] Suicide Risk Assessment (Signed)
[place] INPATIENT:  Family/Significant Other Suicide Prevention Education  Suicide Prevention Education:  Education Completed; OTHOR, VINES [phone] has been identified by the patient as the family member/significant other with whom the patient will be residing, and identified as the person(s) who will aid the patient in the event of a mental health crisis (suicidal ideations/suicide attempt).  With written consent from the patient, the family member/significant other has been provided the following suicide prevention education, prior to the and/or following the discharge of the patient.  The suicide prevention education provided includes the following:  Suicide risk factors  Suicide prevention and interventions  National Suicide Hotline telephone number  [facility] assessment telephone number  [facility] Emergency Assistance [place] and/or Residential Mobile Crisis Unit telephone number  Request made of family/significant other to:  Remove weapons (e.g., guns, rifles, knives), all items previously/currently identified as safety concern.    Remove drugs/medications (over-the-counter, prescriptions, illicit drugs), all items previously/currently identified as a safety concern.  The family member/significant other verbalizes understanding of the suicide prevention education information provided.  The family member/significant other agrees to remove the items of safety concern listed above.  CSW spoke with pts sister who reported that pt is in the hospital due to his mental state. Per Freida Busman, pt is currently homeless and has no access to guns. Candy expressed no concerns for HI but expresses SI concerns due to pt attempting suicide earlier this year. Per Freida Busman, pt has a daughter who is in [place] and wants to help pt.  [place] MSW LCSW 01/10/2019, 1:21 PM

## 2019-01-11 NOTE — Plan of Care (Signed)
Patient is pleasant upon approach and oriented x 4, has positive coping skills and has voice no concerns , mood is good and affect is normal , sleep is restful , patient is maintaining his medical  therapeutic regimen, attends groups with good participations , denies any SI/HI/AVH,  patient has good energy levels and engaging appropriately, support and teaching is provided , only requiring 15 minutes safety checks no distress.   Problem: Education: Goal: Knowledge of [place] [facility] Education information/materials will improve Outcome: Progressing Goal: Emotional status will improve Outcome: Progressing Goal: Mental status will improve Outcome: Progressing Goal: Verbalization of understanding the information provided will improve Outcome: Progressing   Problem: Activity: Goal: Interest or engagement in activities will improve Outcome: Progressing Goal: Sleeping patterns will improve Outcome: Progressing   Problem: Coping: Goal: Ability to verbalize frustrations and anger appropriately will improve Outcome: Progressing Goal: Ability to demonstrate self-control will improve Outcome: Progressing   Problem: Health Behavior/Discharge Planning: Goal: Identification of resources available to assist in meeting health care needs will improve Outcome: Progressing Goal: Compliance with treatment plan for underlying cause of condition will improve Outcome: Progressing   Problem: Safety: Goal: Periods of time without injury will increase Outcome: Progressing   Problem: Education: Goal: Will be free of psychotic symptoms Outcome: Progressing Goal: Knowledge of the prescribed therapeutic regimen will improve Outcome: Progressing   Problem: Coping: Goal: Coping ability will improve Outcome: Progressing Goal: Will verbalize feelings Outcome: Progressing   Problem: Role Relationship: Goal: Ability to interact with others will improve Outcome: Progressing   Problem: Safety: Goal:  Ability to redirect hostility and anger into socially appropriate behaviors will improve Outcome: Progressing

## 2019-01-11 NOTE — Progress Notes (Signed)
D: Patient stated slept good last night .Stated appetite is good and energy level  Is normal. Stated concentration is good . Stated on Depression scale 4 , hopeless 4 and anxiety 4 .( low 0-10 high) Denies suicidal  homicidal ideations  .  No auditory hallucinations  No pain concerns . Appropriate ADL'S. Interacting with peers and staff. Encourage patient participation with unit programing , encourage verbalization of feeling in therapy groups. Patient knowledgeable of information received concerning medication. Voice no concerns around sleep and wake cycle . Patient working on Estate agent , and anxiety. Patient denies suicidal ideations   A: Encourage patient participation with unit programming . Instruction  Given on  Medication , verbalize understanding.  R: Voice no other concerns. Staff continue to monitor

## 2019-01-11 NOTE — Progress Notes (Signed)
CSW spoke with pt regarding discharge plan. Pt reported he talked with his daughter who was looking for a place for him to stay. When asked if he was going to still be in [place] area, pt reported "No, I'm sick of that place" and reported he needed follow up in [place]. Pt then started talking about going to [place], the [place], [place], [place], and [place]. CSW unable to assess pts discharge plan at this time.    Evalina Field, MSW, LCSW Clinical Social Work 01/11/2019 2:57 PM

## 2019-01-11 NOTE — Plan of Care (Signed)
Encourage patient participation with unit programing , encourage verbalization of feeling in therapy groups. Patient knowledgeable of information received concerning medication. Voice no concerns around sleep and wake cycle . Patient working on Estate agent , and anxiety. Patient denies suicidal ideations  Problem: Education: Goal: Will be free of psychotic symptoms Outcome: Progressing Goal: Knowledge of the prescribed therapeutic regimen will improve Outcome: Progressing   Problem: Coping: Goal: Coping ability will improve Outcome: Progressing Goal: Will verbalize feelings Outcome: Progressing   Problem: Role Relationship: Goal: Ability to interact with others will improve Outcome: Progressing   Problem: Safety: Goal: Ability to redirect hostility and anger into socially appropriate behaviors will improve Outcome: Progressing

## 2019-01-11 NOTE — Progress Notes (Signed)
[facility] MD Progress Note  01/11/2019 12:52 PM Randall [name]  MRN:  [id number]   Subjective:  Patient is seen and assessed by writer. During the evaluation patient is euphoric and presents with a tangential state. He continues to have some disorganized thoughts as he makes reference to CSI, religious preoccupations, and thoughts taking over. Initially he appears to be lucid, as the conversation continues he begins to focus on his thoughts. "My thoughts vs mankind thoughts. They are trying to get in my head. We were watching CSI, and it tries to alter the path to my thoughts. The devil is still running and I would rather watch cartoons than to watch CSI. That is in the past and I cant watch it anymore. " Patient endorses a better mood yesterday which he compares his thoughts to his dreams. He states he is trying to decipher his dreams. He is compliant with his medications during this hospital stay.  He also inquires about obtaining his SS card and ID. " I went to go vote and they said I needed those things. They treated me like shit so I had to go militia in on them." He denies any si/ji/avh at this time, he does not appear to be preoccupied or repsonding to internal stimuli.   Principal Problem: Schizoaffective disorder, bipolar type ([place]) Diagnosis: Principal Problem:   Schizoaffective disorder, bipolar type ([place]) Active Problems:   CML (chronic myelocytic leukemia) (HCC)   Amphetamine abuse (HCC)   Amphetamine and psychostimulant-induced psychosis with delusions ([place])  Total Time spent with patient: 20 minutes  Past Psychiatric History:  Patient has a past history of similar episodes of this kind of presentation unclear how much of it is just amphetamines versus other mental illness  Past Medical History:  Past Medical History:  Diagnosis Date  . CML (chronic myelocytic leukemia) ([place]) 2008    Past Surgical History:  Procedure Laterality Date  . 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 No pertinent family history. Family Psychiatric  History: None known Social History:  Social History   Substance and Sexual Activity  Alcohol Use Not Currently     Social History   Substance and Sexual Activity  Drug Use Yes  . Types: Marijuana   Comment: patient is evasive in disclosing specifics concerning his drug use    Social History   Socioeconomic History  . Marital status: Divorced    Spouse name: Not on file  . Number of children: Not on file  . Years of education: Not on file  . Highest education level: Not on file  Occupational History  . Not on file  Social Needs  . Financial resource strain: Not on file  . Food insecurity    Worry: Not on file    Inability: Not on file  . Transportation needs    Medical: Not on file    Non-medical: Not on file  Tobacco Use  . Smoking status: Current Every Day Smoker    Types: Cigarettes    Start date: 06/30/2011  . Smokeless tobacco: Never Used  Substance and Sexual Activity  . Alcohol use: Not Currently  . Drug use: Yes    Types: Marijuana    Comment: patient is evasive in disclosing specifics concerning his drug use  . Sexual activity: Not Currently  Lifestyle  . Physical activity    Days per week: Not on file    Minutes per session: Not on file  . Stress: Not on file  Relationships  .  Social Herbalist on phone: Not on file    Gets together: Not on file    Attends religious service: Not on file    Active member of club or organization: Not on file    Attends meetings of clubs or organizations: Not on file    Relationship status: Not on file  Other Topics Concern  . Not on file  Social History Narrative   ** Merged History Encounter **       Additional Social History:                         Sleep: Good  Appetite:  Good  Current Medications: Current Facility-Administered Medications  Medication Dose Route Frequency Provider Last Rate Last Dose  . alum  & mag hydroxide-simeth (MAALOX/MYLANTA) 200-200-20 MG/5ML suspension 30 mL  30 mL Oral Q4H PRN Money, Lowry Ram, FNP      . hydrOXYzine (ATARAX/VISTARIL) tablet 25 mg  25 mg Oral TID PRN Money, Lowry Ram, FNP      . ibuprofen (ADVIL) tablet 400 mg  400 mg Oral Q6H PRN Money, Lowry Ram, FNP   400 mg at 01/10/19 0823  . magnesium hydroxide (MILK OF MAGNESIA) suspension 30 mL  30 mL Oral Daily PRN Money, Lowry Ram, FNP      . OLANZapine zydis (ZYPREXA) disintegrating tablet 15 mg  15 mg Oral BID Clapacs, John T, MD   15 mg at 01/11/19 0800  . traZODone (DESYREL) tablet 50 mg  50 mg Oral QHS PRN Money, Lowry Ram, FNP        Lab Results: No results found for this or any previous visit (from the past 48 hour(s)).  Blood Alcohol level:  Lab Results  Component Value Date   ETH 41 (H) 08/05/2018   ETH <10 123XX123    Metabolic Disorder Labs: Lab Results  Component Value Date   HGBA1C 6.1 (H) 07/30/2018   MPG 128.37 07/30/2018   No results found for: PROLACTIN Lab Results  Component Value Date   CHOL 184 07/30/2018   TRIG 144 07/30/2018   HDL 43 07/30/2018   CHOLHDL 4.3 07/30/2018   VLDL 29 07/30/2018   LDLCALC 112 (H) 07/30/2018    Physical Findings: AIMS:  , ,  ,  ,    CIWA:    COWS:     Musculoskeletal: Strength & Muscle Tone: within normal limits Gait & Station: normal Patient leans: N/A  Psychiatric Specialty Exam: Physical Exam  Nursing note and vitals reviewed. Constitutional: He is oriented to person, place, and time. He appears well-developed and well-nourished.  Cardiovascular: Normal rate.  Respiratory: Effort normal.  Musculoskeletal: Normal range of motion.  Neurological: He is alert and oriented to person, place, and time.  Skin: Skin is warm.    ROS   Blood pressure (!) 127/93, pulse 75, temperature 98.6 F (37 C), temperature source Oral, resp. rate 18, height 5\' 7"  (1.702 m), weight 64.9 kg, SpO2 99 %.Body mass index is 22.4 kg/m.  General Appearance:  Disheveled  Eye Contact:  Fair  Speech:  Clear and Coherent and Normal Rate  Volume:  Normal  Mood:  Euthymic  Affect:  Congruent  Thought Process:  Disorganized, Irrelevant and Descriptions of Associations: Tangential  Orientation:  Full (Time, Place, and Person)  Thought Content:  Illogical, Ideas of Reference:   Paranoia Delusions and Paranoid Ideation  Suicidal Thoughts:  No  Homicidal Thoughts:  No  Memory:  Immediate;  Fair Recent;   Fair Remote;   Fair  Judgement:  Impaired  Insight:  Lacking  Psychomotor Activity:  Normal  Concentration:  Concentration: Poor  Recall:  [employer] of Knowledge:  Poor  Language:  Good  Akathisia:  No  Handed:  Right  AIMS (if indicated):     Assets:  Communication Skills Physical Health  ADL's:  Intact  Cognition:  WNL  Sleep:  Number of Hours: 7.5   Assessment: Patient continues to make bizarre statements and nonsensical comments. He reports an improved mood and medication compliance. He is alert and oriented throughout the evaluation otherwise.    Treatment Plan Summary: Daily contact with patient to assess and evaluate symptoms and progress in treatment and Medication management Continue Vistaril 25 mg p.o. 3 times daily as needed for anxiety Continue Zyprexa Zydis 15 mg p.o. twice daily for schizoaffective disorder Continue trazodone 50 mg p.o. nightly as needed for insomnia Encourage group therapy participation Continue every 15 minute safety checks  Suella Broad, FNP 01/11/2019, 12:52 PM

## 2019-01-11 NOTE — BHH Group Notes (Signed)
Feelings Around Diagnosis 01/11/2019 1PM  Type of Therapy/Topic:  Group Therapy:  Feelings about Diagnosis  Participation Level:  Did Not Attend   Description of Group:   This group will allow patients to explore their thoughts and feelings about diagnoses they have received. Patients will be guided to explore their level of understanding and acceptance of these diagnoses. Facilitator will encourage patients to process their thoughts and feelings about the reactions of others to their diagnosis and will guide patients in identifying ways to discuss their diagnosis with significant others in their lives. This group will be process-oriented, with patients participating in exploration of their own experiences, giving and receiving support, and processing challenge from other group members.   Therapeutic Goals: 1. Patient will demonstrate understanding of diagnosis as evidenced by identifying two or more symptoms of the disorder 2. Patient will be able to express two feelings regarding the diagnosis 3. Patient will demonstrate their ability to communicate their needs through discussion and/or role play  Summary of Patient Progress:       Therapeutic Modalities:   Cognitive Behavioral Therapy Brief Therapy Feelings Identification    Yvette Rack, LCSW 01/11/2019 1:58 PM

## 2019-01-11 NOTE — Progress Notes (Signed)
Recreation Therapy Notes  Date: 01/11/2019  Time: 9:30 am   Location: Craft room   Behavioral response: N/A   Intervention Topic: Relaxation  Discussion/Intervention: Patient did not attend group.   Clinical Observations/Feedback:  Patient did not attend group.   Ezell Poke LRT/CTRS        Kamerin Axford 01/11/2019 11:25 AM

## 2019-01-12 MED ORDER — BENZTROPINE MESYLATE 1 MG PO TABS: 1.0000 mg | ORAL_TABLET | Freq: Every day | ORAL | Status: DC

## 2019-01-12 MED ORDER — BENZTROPINE MESYLATE 1 MG PO TABS: 1.0000 mg | ORAL_TABLET | Freq: Every day | ORAL | 0 refills | Status: DC

## 2019-01-12 MED ORDER — TRAZODONE HCL 50 MG PO TABS: 50.0000 mg | ORAL_TABLET | Freq: Every evening | ORAL | 0 refills | Status: DC | PRN

## 2019-01-12 MED ORDER — OLANZAPINE 5 MG PO TABS
15.0000 mg | ORAL_TABLET | Freq: Two times a day (BID) | ORAL | Status: DC
  Administered 2019-01-12 – 2019-01-13 (×3): 15 mg via ORAL
  Filled 2019-01-12 (×3): qty 1

## 2019-01-12 MED ORDER — OLANZAPINE 15 MG PO TABS: 15.0000 mg | ORAL_TABLET | Freq: Two times a day (BID) | ORAL | 0 refills | Status: DC

## 2019-01-12 NOTE — Progress Notes (Signed)
[facility] MD Progress Note  01/12/2019 3:12 PM Randall [name]  MRN:  [id number] Subjective: Follow-up for this patient with schizoaffective disorder.  Patient seen and chart reviewed.  Compared to the last time I spoke with him he seems to be doing better.  He was able to sit down and have a conversation for several minutes without getting into anything bizarre.  Later in the conversation he still did talk about being a God but he did not go off on the most bizarre stuff that he was talking about before.  Instead he was able to have some fairly rational discussions.  He still tells me that he does not think the medicine is "working".  Talks about how he does not really need medicine he just needs "counseling".  He was able to be diverted from this to some extent though.  Has not been aggressive or threatening or causing any problems on the ward. Principal Problem: Schizoaffective disorder, bipolar type ([place]) Diagnosis: Principal Problem:   Schizoaffective disorder, bipolar type ([place]) Active Problems:   CML (chronic myelocytic leukemia) (HCC)   Amphetamine abuse (HCC)   Amphetamine and psychostimulant-induced psychosis with delusions ([place])  Total Time spent with patient: 30 minutes  Past Psychiatric History: Patient has a history of substance abuse and psychotic symptoms with some confusion about whether the psychosis is primarily caused by the substance abuse or not  Past Medical History:  Past Medical History:  Diagnosis Date  . CML (chronic myelocytic leukemia) ([place]) 2008    Past Surgical History:  Procedure Laterality Date  . 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 No pertinent family history. Family Psychiatric  History: See previous Social History:  Social History   Substance and Sexual Activity  Alcohol Use Not Currently     Social History   Substance and Sexual Activity  Drug Use Yes  . Types: Marijuana   Comment: patient is evasive in disclosing  specifics concerning his drug use    Social History   Socioeconomic History  . Marital status: Divorced    Spouse name: Not on file  . Number of children: Not on file  . Years of education: Not on file  . Highest education level: Not on file  Occupational History  . Not on file  Social Needs  . Financial resource strain: Not on file  . Food insecurity    Worry: Not on file    Inability: Not on file  . Transportation needs    Medical: Not on file    Non-medical: Not on file  Tobacco Use  . Smoking status: Current Every Day Smoker    Types: Cigarettes    Start date: 06/30/2011  . Smokeless tobacco: Never Used  Substance and Sexual Activity  . Alcohol use: Not Currently  . Drug use: Yes    Types: Marijuana    Comment: patient is evasive in disclosing specifics concerning his drug use  . Sexual activity: Not Currently  Lifestyle  . Physical activity    Days per week: Not on file    Minutes per session: Not on file  . Stress: Not on file  Relationships  . Social Herbalist on phone: Not on file    Gets together: Not on file    Attends religious service: Not on file    Active member of club or organization: Not on file    Attends meetings of clubs or organizations: Not on file    Relationship status: Not  on file  Other Topics Concern  . Not on file  Social History Narrative   ** Merged History Encounter **       Additional Social History:                         Sleep: Fair  Appetite:  Fair  Current Medications: Current Facility-Administered Medications  Medication Dose Route Frequency Provider Last Rate Last Dose  . alum & mag hydroxide-simeth (MAALOX/MYLANTA) 200-200-20 MG/5ML suspension 30 mL  30 mL Oral Q4H PRN Money, Darnelle Maffucci B, FNP      . benztropine (COGENTIN) tablet 1 mg  1 mg Oral QHS ,  T, MD      . hydrOXYzine (ATARAX/VISTARIL) tablet 25 mg  25 mg Oral TID PRN Money, Lowry Ram, FNP      . ibuprofen (ADVIL) tablet 400 mg   400 mg Oral Q6H PRN Money, Lowry Ram, FNP   400 mg at 01/11/19 1526  . magnesium hydroxide (MILK OF MAGNESIA) suspension 30 mL  30 mL Oral Daily PRN Money, Lowry Ram, FNP      . OLANZapine (ZYPREXA) tablet 15 mg  15 mg Oral BID ,  T, MD      . traZODone (DESYREL) tablet 50 mg  50 mg Oral QHS PRN Money, Lowry Ram, FNP        Lab Results: No results found for this or any previous visit (from the past 48 hour(s)).  Blood Alcohol level:  Lab Results  Component Value Date   ETH 41 (H) 08/05/2018   ETH <10 123XX123    Metabolic Disorder Labs: Lab Results  Component Value Date   HGBA1C 6.1 (H) 07/30/2018   MPG 128.37 07/30/2018   No results found for: PROLACTIN Lab Results  Component Value Date   CHOL 184 07/30/2018   TRIG 144 07/30/2018   HDL 43 07/30/2018   CHOLHDL 4.3 07/30/2018   VLDL 29 07/30/2018   LDLCALC 112 (H) 07/30/2018    Physical Findings: AIMS:  , ,  ,  ,    CIWA:    COWS:     Musculoskeletal: Strength & Muscle Tone: within normal limits Gait & Station: normal Patient leans: N/A  Psychiatric Specialty Exam: Physical Exam  Nursing note and vitals reviewed. Constitutional: He appears well-developed and well-nourished.  HENT:  Head: Normocephalic and atraumatic.  Eyes: Pupils are equal, round, and reactive to light. Conjunctivae are normal.  Neck: Normal range of motion.  Cardiovascular: Normal heart sounds.  Respiratory: Effort normal.  GI: Soft.  Musculoskeletal: Normal range of motion.  Neurological: He is alert.  Skin: Skin is warm and dry.  Psychiatric: He has a normal mood and affect. His speech is normal and behavior is normal. Cognition and memory are normal. He expresses impulsivity. He expresses no homicidal and no suicidal ideation.    Review of Systems  Constitutional: Negative.   HENT: Negative.   Eyes: Negative.   Respiratory: Negative.   Cardiovascular: Negative.   Gastrointestinal: Negative.   Musculoskeletal: Negative.    Skin: Negative.   Neurological: Negative.   Psychiatric/Behavioral: Positive for substance abuse. Negative for depression, hallucinations and suicidal ideas. The patient is nervous/anxious and has insomnia.     Blood pressure 121/90, pulse 76, temperature 98.3 F (36.8 C), temperature source Oral, resp. rate 17, height 5\' 7"  (1.702 m), weight 64.9 kg, SpO2 99 %.Body mass index is 22.4 kg/m.  General Appearance: Casual  Eye Contact:  Fair  Speech:  Clear and Coherent  Volume:  Normal  Mood:  Euthymic  Affect:  Congruent  Thought Process:  Goal Directed  Orientation:  Full (Time, Place, and Person)  Thought Content:  Logical  Suicidal Thoughts:  No  Homicidal Thoughts:  No  Memory:  Immediate;   Fair Recent;   Fair Remote;   Fair  Judgement:  Fair  Insight:  Fair  Psychomotor Activity:  Normal  Concentration:  Concentration: Fair  Recall:  [employer] of Knowledge:  Fair  Language:  Fair  Akathisia:  No  Handed:  Right  AIMS (if indicated):     Assets:  Desire for Improvement Physical Health  ADL's:  Intact  Cognition:  Impaired,  Mild  Sleep:  Number of Hours: 7.5     Treatment Plan Summary: Daily contact with patient to assess and evaluate symptoms and progress in treatment, Medication management and Plan We talked about whether he had been in touch with his daughter who had indicated to our staff that she was working on a discharge plan.  He has some excuses about how the number is not working right.  He has certainly been informed of the potential plan and asked to get in touch with her.  I let them know that I think discharge would be appropriate in 1 to 2 days and I have gone ahead and put in for a 7-day supply of medicine in case we would be ready to discharge him as early as tomorrow.  No change in any medicine other than to switch the dissolvable Zyprexa for the plain form.  Also he is saying he is having "muscle spasms" under his arms when he tries to go to sleep.  He  blames the medicine for this.  I am not sure if he is really describing a serious side effect but we will go ahead and add some Cogentin at night which she had taken in the past but was not getting so far.  Alethia Berthold, MD 01/12/2019, 3:12 PM

## 2019-01-12 NOTE — Plan of Care (Signed)
Encourage patient participation with unit programing , encourage verbalization of feeling  in therapy groups. Patient knowledgeable of information received  concerning medication.   Voice no concerns around sleep and wake cycle .  Patient working on  Estate agent , and anxiety. Patient  denies suicidal ideations    Problem: Education: Goal: Knowledge of [facility] Education information/materials will improve Outcome: Progressing Goal: Emotional status will improve Outcome: Progressing Goal: Mental status will improve Outcome: Progressing Goal: Verbalization of understanding the information provided will improve Outcome: Progressing   Problem: Activity: Goal: Interest or engagement in activities will improve Outcome: Progressing Goal: Sleeping patterns will improve Outcome: Progressing   Problem: Coping: Goal: Ability to verbalize frustrations and anger appropriately will improve Outcome: Progressing Goal: Ability to demonstrate self-control will improve Outcome: Progressing   Problem: Health Behavior/Discharge Planning: Goal: Identification of resources available to assist in meeting health care needs will improve Outcome: Progressing Goal: Compliance with treatment plan for underlying cause of condition will improve Outcome: Progressing   Problem: Safety: Goal: Periods of time without injury will increase Outcome: Progressing   Problem: Education: Goal: Will be free of psychotic symptoms Outcome: Progressing Goal: Knowledge of the prescribed therapeutic regimen will improve Outcome: Progressing   Problem: Coping: Goal: Coping ability will improve Outcome: Progressing Goal: Will verbalize feelings Outcome: Progressing   Problem: Safety: Goal: Ability to redirect hostility and anger into socially appropriate behaviors will improve Outcome: Progressing

## 2019-01-12 NOTE — Plan of Care (Signed)
Patient is calm and responding well to treatment regimen , patient is adjusting well in the unit , takes his medicines , patient stated I'm doing fine and has not complained,  sleep is restful , denies any SI/HI/AVH , participate in social activities with out any issues , safety is maintained no distress.   Problem: Education: Goal: Knowledge of  General Education information/materials will improve Outcome: Progressing Goal: Emotional status will improve Outcome: Progressing Goal: Mental status will improve Outcome: Progressing Goal: Verbalization of understanding the information provided will improve Outcome: Progressing   Problem: Activity: Goal: Interest or engagement in activities will improve Outcome: Progressing Goal: Sleeping patterns will improve Outcome: Progressing   Problem: Coping: Goal: Ability to verbalize frustrations and anger appropriately will improve Outcome: Progressing Goal: Ability to demonstrate self-control will improve Outcome: Progressing   Problem: Health Behavior/Discharge Planning: Goal: Identification of resources available to assist in meeting health care needs will improve Outcome: Progressing Goal: Compliance with treatment plan for underlying cause of condition will improve Outcome: Progressing   Problem: Safety: Goal: Periods of time without injury will increase Outcome: Progressing   Problem: Education: Goal: Will be free of psychotic symptoms Outcome: Progressing Goal: Knowledge of the prescribed therapeutic regimen will improve Outcome: Progressing   Problem: Coping: Goal: Coping ability will improve Outcome: Progressing Goal: Will verbalize feelings Outcome: Progressing   Problem: Role Relationship: Goal: Ability to interact with others will improve Outcome: Progressing   Problem: Safety: Goal: Ability to redirect hostility and anger into socially appropriate behaviors will improve Outcome: Progressing

## 2019-01-12 NOTE — B[facility] Group Notes (Signed)
LCSW Group Therapy Note  01/12/2019 2:12 PM  Type of Therapy/Topic:  Group Therapy:  Emotion Regulation  Participation Level:  Did Not Attend   Description of Group:   The purpose of this group is to assist patients in learning to regulate negative emotions and experience positive emotions. Patients will be guided to discuss ways in which they have been vulnerable to their negative emotions. These vulnerabilities will be juxtaposed with experiences of positive emotions or situations, and patients will be challenged to use positive emotions to combat negative ones. Special emphasis will be placed on coping with negative emotions in conflict situations, and patients will process healthy conflict resolution skills.  Therapeutic Goals: 1. Patient will identify two positive emotions or experiences to reflect on in order to balance out negative emotions 2. Patient will label two or more emotions that they find the most difficult to experience 3. Patient will demonstrate positive conflict resolution skills through discussion and/or role plays  Summary of Patient Progress: x   Therapeutic Modalities:   Cognitive Behavioral Therapy Feelings Identification Dialectical Behavioral Therapy   Evalina Field, MSW, LCSW Clinical Social Work 01/12/2019 2:12 PM

## 2019-01-12 NOTE — Progress Notes (Signed)
D:Schizoaffective   A:Patient stated slept good last night .Stated appetite is good and energy level  Is normal. Stated concentration is good . Stated on Depression scale 5, hopeless 5 and anxiety 5 .( low 0-10 high) Denies suicidal  homicidal ideations  .  No auditory hallucinations  No pain concerns . Appropriate ADL'S. Interacting with peers and staff. Encourage patient participation with unit programing , encourage verbalization of feeling in therapy groups. Patient knowledgeable of information received concerning medication. Voice no concerns around sleep and wake cycle . Patient working on Estate agent , and anxiety.  Encourage patient participation with unit programming . Instruction  Given on  Medication , verbalize understanding. Patient  Voice of wanting to see his family , reaching out to hold conversation .   R: Voice no other concerns. Staff continue to monitor

## 2019-01-12 NOTE — Progress Notes (Signed)
Recreation Therapy Notes  Date: 01/12/2019  Time: 9:30 am   Location: Craft room   Behavioral response: N/A   Intervention Topic: Problem Solving  Discussion/Intervention: Patient did not attend group.   Clinical Observations/Feedback:  Patient did not attend group.   Hephzibah Strehle LRT/CTRS        Kenedi Cilia 01/12/2019 10:50 AM

## 2019-01-12 NOTE — Progress Notes (Signed)
CSW spoke with pts daughter regarding discharge plan for housing. Lelon Frohlich reported that she is currently still looking for a place for pt to go. Per Lelon Frohlich, pt wants to stay with her but she is unsure if he can stay because she has children and she doesn't know if her children will be safe around pt with his current mental state. Ann reported plans to talk with pt today to figure out where his disability check is going so she can work on putting that money towards a place for him to stay.   Evalina Field, MSW, LCSW Clinical Social Work 01/12/2019 11:32 AM

## 2019-01-13 MED ORDER — BENZTROPINE MESYLATE 1 MG PO TABS: 1.0000 mg | ORAL_TABLET | Freq: Every day | ORAL | 1 refills | Status: DC

## 2019-01-13 MED ORDER — TRAZODONE HCL 50 MG PO TABS: 50.0000 mg | ORAL_TABLET | Freq: Every evening | ORAL | 1 refills | Status: DC | PRN

## 2019-01-13 MED ORDER — OLANZAPINE 15 MG PO TABS: 15.0000 mg | ORAL_TABLET | Freq: Two times a day (BID) | ORAL | 1 refills | Status: DC

## 2019-01-13 NOTE — Progress Notes (Signed)
D - Patient was in the day room upon arrival to the unit. Patient was pleasant during assessment denying SI/HI/AVH, pain anxiety and depression with this Probation officer. Patient was observed interacting appropriately with staff and peers on the unit.   A - Patient refused his medication this evening. Patient given education about his scheduled medication and patient stated, "I a good, thank you though." Patient given support and encouragement to be active in his treatment plan. Patient informed to let staff know if there are any issues or problems on the unit.   R - Patient being monitored Q 15 minutes for safety per unit protocol. Patient remains safe on the unit.

## 2019-01-13 NOTE — Plan of Care (Signed)
  Problem: Education: Goal: Knowledge of [facility] Education information/materials will improve Outcome: Adequate for Discharge Goal: Emotional status will improve Outcome: Adequate for Discharge Goal: Mental status will improve Outcome: Adequate for Discharge Goal: Verbalization of understanding the information provided will improve Outcome: Adequate for Discharge   Problem: Activity: Goal: Interest or engagement in activities will improve Outcome: Adequate for Discharge Goal: Sleeping patterns will improve Outcome: Adequate for Discharge   Problem: Coping: Goal: Ability to verbalize frustrations and anger appropriately will improve Outcome: Adequate for Discharge Goal: Ability to demonstrate self-control will improve Outcome: Adequate for Discharge   Problem: Health Behavior/Discharge Planning: Goal: Identification of resources available to assist in meeting health care needs will improve Outcome: Adequate for Discharge Goal: Compliance with treatment plan for underlying cause of condition will improve Outcome: Adequate for Discharge   Problem: Safety: Goal: Periods of time without injury will increase Outcome: Adequate for Discharge   Problem: Education: Goal: Will be free of psychotic symptoms Outcome: Adequate for Discharge Goal: Knowledge of the prescribed therapeutic regimen will improve Outcome: Adequate for Discharge   Problem: Coping: Goal: Coping ability will improve Outcome: Adequate for Discharge Goal: Will verbalize feelings Outcome: Adequate for Discharge   Problem: Role Relationship: Goal: Ability to interact with others will improve Outcome: Adequate for Discharge   Problem: Safety: Goal: Ability to redirect hostility and anger into socially appropriate behaviors will improve Outcome: Adequate for Discharge

## 2019-01-13 NOTE — BHH Group Notes (Signed)
LCSW Group Therapy Note  01/13/2019 1:00 PM  Type of Therapy/Topic:  Group Therapy:  Balance in Life  Participation Level:  Did Not Attend  Description of Group:    This group will address the concept of balance and how it feels and looks when one is unbalanced. Patients will be encouraged to process areas in their lives that are out of balance and identify reasons for remaining unbalanced. Facilitators will guide patients in utilizing problem-solving interventions to address and correct the stressor making their life unbalanced. Understanding and applying boundaries will be explored and addressed for obtaining and maintaining a balanced life. Patients will be encouraged to explore ways to assertively make their unbalanced needs known to significant others in their lives, using other group members and facilitator for support and feedback.  Therapeutic Goals: 1. Patient will identify two or more emotions or situations they have that consume much of in their lives. 2. Patient will identify signs/triggers that life has become out of balance:  3. Patient will identify two ways to set boundaries in order to achieve balance in their lives:  4. Patient will demonstrate ability to communicate their needs through discussion and/or role plays  Summary of Patient Progress: X     Therapeutic Modalities:   Cognitive Behavioral Therapy Solution-Focused Therapy Assertiveness Training  Assunta Curtis MSW, LCSW 01/13/2019 2:31 PM

## 2019-01-13 NOTE — Progress Notes (Signed)
Recreation Therapy Notes   Date: 01/13/2019  Time: 9:30 am  Location: Craft room  Behavioral response: Appropriate   Intervention Topic: Emotions   Discussion/Intervention:   Group content on today was focused on emotions. The group identified what emotions are and why it is important to have emotions. Patients expressed some positive and negative emotions. Individuals gave some past experiences on how they normally dealt with emotions in the past. The group described some positive ways to deal with emotions in the future. Patients participated in the intervention "Name the Randall [name]" where individuals were given a chance to experience different emotions.  Clinical Observations/Feedback:  Patient came to group and defined emotions as a reaction to a situation. He stated that you get what you give when it comes to emotions. Individual was social with peers and staff while participating in group.  Randall [name] LRT/CTRS         Randall [name] 01/13/2019 11:29 AM

## 2019-01-13 NOTE — Progress Notes (Signed)
  [facility] Adult Case Management Discharge Plan :  Will you be returning to the same living situation after discharge:  No. pt's daughter is assisting him with finding housing At discharge, do you have transportation home?: Yes,  daughter will pick up Do you have the ability to pay for your medications: Yes,  medicare  Release of information consent forms completed and in the chart;  Patient's signature needed at discharge.  Patient to Follow up at: [facility] Go on 01/17/2019.   Why: You have an appointment on Monday, November 16th at 930am. Please bring proof of income, photo ID, social security card, hospital discharge paperwork. Thank you. Contact information: [place] [id number] [phone]           Next level of care provider has access to [facility] and Suicide Prevention discussed: Yes,  Franisco Fedorov, daughter  Have you used any form of tobacco in the last 30 days? (Cigarettes, Smokeless Tobacco, Cigars, and/or Pipes): No  Has patient been referred to the Quitline?: N/A patient is not a smoker  Patient has been referred for addiction treatment: N/A  Yvette Rack, LCSW 01/13/2019, 10:13 AM

## 2019-01-13 NOTE — Tx Team (Signed)
Interdisciplinary Treatment and Diagnostic Plan Update  01/13/2019 Time of Session: 8:30AM Randall [name] MRN: [id number]  Principal Diagnosis: Schizoaffective disorder, bipolar type ([place])  Secondary Diagnoses: Principal Problem:   Schizoaffective disorder, bipolar type ([place]) Active Problems:   CML (chronic myelocytic leukemia) ([place])   Amphetamine abuse ([place])   Amphetamine and psychostimulant-induced psychosis with delusions ([place])   Current Medications:  Current Facility-Administered Medications  Medication Dose Route Frequency Provider Last Rate Last Dose  . alum & mag hydroxide-simeth (MAALOX/MYLANTA) 200-200-20 MG/5ML suspension 30 mL  30 mL Oral Q4H PRN Money, Darnelle Maffucci B, FNP      . benztropine (COGENTIN) tablet 1 mg  1 mg Oral QHS Clapacs, John T, MD      . hydrOXYzine (ATARAX/VISTARIL) tablet 25 mg  25 mg Oral TID PRN Money, Lowry Ram, FNP      . ibuprofen (ADVIL) tablet 400 mg  400 mg Oral Q6H PRN Money, Lowry Ram, FNP   400 mg at 01/11/19 1526  . magnesium hydroxide (MILK OF MAGNESIA) suspension 30 mL  30 mL Oral Daily PRN Money, Lowry Ram, FNP      . OLANZapine (ZYPREXA) tablet 15 mg  15 mg Oral BID Clapacs, Madie Reno, MD   15 mg at 01/13/19 0744  . traZODone (DESYREL) tablet 50 mg  50 mg Oral QHS PRN Money, Lowry Ram, FNP       PTA Medications: Medications Prior to Admission  Medication Sig Dispense Refill Last Dose  . benztropine (COGENTIN) 1 MG tablet Take 1 tablet (1 mg total) by mouth daily. 60 tablet 2   . diclofenac sodium (VOLTAREN) 1 % GEL Apply 2 g topically 4 (four) times daily. (Patient not taking: Reported on 07/27/2018) 3 Tube 1   . divalproex (DEPAKOTE) 500 MG DR tablet Take 3 tablets (1,500 mg total) by mouth at bedtime. 90 tablet 1   . haloperidol (HALDOL) 10 MG tablet 1 in am 2 at h s 90 tablet 2   . haloperidol decanoate (HALDOL DECANOATE) 100 MG/ML injection Inject 1.5 mLs (150 mg total) into the muscle once for 1 dose. Next due 7/3 1.5 mL 11   .  propranolol (INDERAL) 40 MG tablet Take 1 tablet (40 mg total) by mouth 2 (two) times daily. 60 tablet 3     Patient Stressors: Financial difficulties Marital or family conflict Substance abuse  Patient Strengths: Capable of independent living General fund of knowledge  Treatment Modalities: Medication Management, Group therapy, Case management,  1 to 1 session with clinician, Psychoeducation, Recreational therapy.   Physician Treatment Plan for Primary Diagnosis: Schizoaffective disorder, bipolar type ([place]) Long Term Goal(s): Improvement in symptoms so as ready for discharge Improvement in symptoms so as ready for discharge   Short Term Goals: Ability to demonstrate self-control will improve Compliance with prescribed medications will improve Ability to maintain clinical measurements within normal limits will improve Ability to identify triggers associated with substance abuse/mental health issues will improve  Medication Management: Evaluate patient's response, side effects, and tolerance of medication regimen.  Therapeutic Interventions: 1 to 1 sessions, Unit Group sessions and Medication administration.  Evaluation of Outcomes: Adequate for Discharge  Physician Treatment Plan for Secondary Diagnosis: Principal Problem:   Schizoaffective disorder, bipolar type ([place]) Active Problems:   CML (chronic myelocytic leukemia) ([facility])   Amphetamine abuse ([facility])   Amphetamine and psychostimulant-induced psychosis with delusions ([place])  Long Term Goal(s): Improvement in symptoms so as ready for discharge Improvement in symptoms so as ready for discharge   Short  Term Goals: Ability to demonstrate self-control will improve Compliance with prescribed medications will improve Ability to maintain clinical measurements within normal limits will improve Ability to identify triggers associated with substance abuse/mental health issues will improve     Medication Management: Evaluate patient's  response, side effects, and tolerance of medication regimen.  Therapeutic Interventions: 1 to 1 sessions, Unit Group sessions and Medication administration.  Evaluation of Outcomes: Adequate for Discharge   RN Treatment Plan for Primary Diagnosis: Schizoaffective disorder, bipolar type ([place]) Long Term Goal(s): Knowledge of disease and therapeutic regimen to maintain health will improve  Short Term Goals: Ability to remain free from injury will improve, Ability to verbalize frustration and anger appropriately will improve, Ability to demonstrate self-control, Ability to participate in decision making will improve, Ability to verbalize feelings will improve, Ability to disclose and discuss suicidal ideas, Ability to identify and develop effective coping behaviors will improve and Compliance with prescribed medications will improve  Medication Management: RN will administer medications as ordered by provider, will assess and evaluate patient's response and provide education to patient for prescribed medication. RN will report any adverse and/or side effects to prescribing provider.  Therapeutic Interventions: 1 on 1 counseling sessions, Psychoeducation, Medication administration, Evaluate responses to treatment, Monitor vital signs and CBGs as ordered, Perform/monitor CIWA, COWS, AIMS and Fall Risk screenings as ordered, Perform wound care treatments as ordered.  Evaluation of Outcomes: Adequate for Discharge   LCSW Treatment Plan for Primary Diagnosis: Schizoaffective disorder, bipolar type ([place]) Long Term Goal(s): Safe transition to appropriate next level of care at discharge, Engage patient in therapeutic group addressing interpersonal concerns.  Short Term Goals: Engage patient in aftercare planning with referrals and resources, Increase social support, Increase ability to appropriately verbalize feelings, Increase emotional regulation, Facilitate acceptance of mental health diagnosis and  concerns, Facilitate patient progression through stages of change regarding substance use diagnoses and concerns, Identify triggers associated with mental health/substance abuse issues and Increase skills for wellness and recovery  Therapeutic Interventions: Assess for all discharge needs, 1 to 1 time with Social worker, Explore available resources and support systems, Assess for adequacy in community support network, Educate family and significant other(s) on suicide prevention, Complete Psychosocial Assessment, Interpersonal group therapy.  Evaluation of Outcomes: Adequate for Discharge   Progress in Treatment: Attending groups: No. Participating in groups: No. Taking medication as prescribed: Yes. Toleration medication: Yes. Family/Significant other contact made: Yes, individual(s) contacted:  Harrington Challenger, sister Patient understands diagnosis: Yes. Discussing patient identified problems/goals with staff: Yes. Medical problems stabilized or resolved: Yes. Denies suicidal/homicidal ideation: Yes Issues/concerns per patient self-inventory: No. Other: NA  New problem(s) identified: No, Describe:  None  New Short Term/Long Term Goal(s):  Engage patient in aftercare planning with referrals and resources, Increase social support, Increase skills for wellness and recovery  Patient Goals:  "to learn more bout myself and to give  A lot more"  Discharge Plan or Barriers: Patient to return home and participate in outpatient services. Update 01/13/19-pt is scheduled for discharge today. Pt will follow up with [facility] on 01/17/19. Pt has been in contact with his daughter and she is working on getting him stable housing and assist him with budgeting his finances.  Reason for Continuation of Hospitalization: D/C 01/13/19  Estimated Length of Stay:  D/C 01/13/19  Attendees: Patient:   01/13/2019 10:07 AM  Physician: Dr. Weber Cooks 01/13/2019 10:07 AM  Nursing: Nat Math [place]  01/13/2019 10:07 AM  RN Care Manager: 01/13/2019 10:07 AM  Social Worker:  Yvette Rack, LCSW Evalina Field [place] 01/13/2019 10:07 AM  Recreational Therapist: Roanna Epley 01/13/2019 10:07 AM  Other:  01/13/2019 10:07 AM  Other:  01/13/2019 10:07 AM  Other: 01/13/2019 10:07 AM    Scribe for Treatment Team: Netta Neat, MSW, LCSW Clinical Social Work 01/13/2019 10:07 AM

## 2019-01-13 NOTE — Discharge Summary (Signed)
Physician Discharge Summary Note  Patient:  Randall [name] is an 53 y.o., male MRN:  [id number] DOB:  1966-02-08 Patient phone:  [phone] (home)  Patient address:   [address] [place] [address],  Total Time spent with patient: 30 minutes  Date of Admission:  01/07/2019 Date of Discharge: 01/13/19  Reason for Admission:  Worsening psychotic symptoms with methamphetamine abuse  Principal Problem: Schizoaffective disorder, bipolar type [facility]) Discharge Diagnoses: Principal Problem:   Schizoaffective disorder, bipolar type ([place]) Active Problems:   CML (chronic myelocytic leukemia) ([place])   Amphetamine abuse ([place])   Amphetamine and psychostimulant-induced psychosis with delusions [facility])   Past Psychiatric History: Patient was admitted to the hospital in [facility] in May and June of this year.  Sounds like it was a very similar hospitalization.  The conclusion at that time was that his symptoms were largely the result of methamphetamine abuse.  There was also a suggestion that he was probably exaggerating or malingering for secondary gain.  He was treated with antipsychotics and discharged with a referral to outpatient care.  Patient in conversation with me he acts like he does not remember anything about it and denies being in any kind of treatment recently.  He has had another psychiatric hospitalization in the last couple years with similar presentation, I think at [facility].  Despite this there are medical notes from only couple years earlier that do not mention any kind of mental health or behavior problems at all.  No known suicide attempts no known actual violence.  Past Medical History:  Past Medical History:  Diagnosis Date  . CML (chronic myelocytic leukemia) ([place]) 2008    Past Surgical History:  Procedure Laterality Date  . 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 No pertinent family history. Family Psychiatric  History: None known Social History:   Social History   Substance and Sexual Activity  Alcohol Use Not Currently     Social History   Substance and Sexual Activity  Drug Use Yes  . Types: Marijuana   Comment: patient is evasive in disclosing specifics concerning his drug use    Social History   Socioeconomic History  . Marital status: Divorced    Spouse name: Not on file  . Number of children: Not on file  . Years of education: Not on file  . Highest education level: Not on file  Occupational History  . Not on file  Social Needs  . Financial resource strain: Not on file  . Food insecurity    Worry: Not on file    Inability: Not on file  . Transportation needs    Medical: Not on file    Non-medical: Not on file  Tobacco Use  . Smoking status: Current Every Day Smoker    Types: Cigarettes    Start date: 06/30/2011  . Smokeless tobacco: Never Used  Substance and Sexual Activity  . Alcohol use: Not Currently  . Drug use: Yes    Types: Marijuana    Comment: patient is evasive in disclosing specifics concerning his drug use  . Sexual activity: Not Currently  Lifestyle  . Physical activity    Days per week: Not on file    Minutes per session: Not on file  . Stress: Not on file  Relationships  . Social Herbalist on phone: Not on file    Gets together: Not on file    Attends religious service: Not on file    Active member  of club or organization: Not on file    Attends meetings of clubs or organizations: Not on file    Relationship status: Not on file  Other Topics Concern  . Not on file  Social History Narrative   ** Merged History [facility] Course:  Patient remained on the [facility] unit for 5 days. The patient stabilized on medication and therapy. Patient was discharged on Cogentin 1 mg PO QHS, Zyprexa 15 mg PO BID, and Trazodone 100 mg PO QHS. Patient has shown improvement with improved mood, affect, sleep, appetite, and interaction. Patient has attended group and  participated. Patient has been seen in the day room interacting with peers and staff appropriately. Patient denies any SI/HI/AVH and contracts for safety. Patient agrees to follow up at [facility]. Patient is provided with prescriptions for their medications upon discharge. Patient showed progressive improvement of his symptoms over the 5 days that he was admitted.   Physical Findings: AIMS:  , ,  ,  ,    CIWA:    COWS:     Musculoskeletal: Strength & Muscle Tone: within normal limits Gait & Station: normal Patient leans: N/A  Psychiatric Specialty Exam: Physical Exam  Nursing note and vitals reviewed. Constitutional: He is oriented to person, place, and time. He appears well-developed and well-nourished.  Cardiovascular: Normal rate.  Respiratory: Effort normal.  Musculoskeletal: Normal range of motion.  Neurological: He is alert and oriented to person, place, and time.  Skin: Skin is warm.    Review of Systems  Constitutional: Negative.   HENT: Negative.   Eyes: Negative.   Respiratory: Negative.   Cardiovascular: Negative.   Gastrointestinal: Negative.   Genitourinary: Negative.   Musculoskeletal: Negative.   Skin: Negative.   Neurological: Negative.   Endo/Heme/Allergies: Negative.   Psychiatric/Behavioral: Negative.     Blood pressure 124/78, pulse 100, temperature 98.7 F (37.1 C), temperature source Oral, resp. rate 18, height 5\' 7"  (1.702 m), weight 64.9 kg, SpO2 97 %.Body mass index is 22.4 kg/m.   General Appearance: Casual  Eye Contact::  Good  Speech:  Clear and Coherent409  Volume:  Normal  Mood:  Euthymic  Affect:  Inappropriate  Thought Process:  Coherent  Orientation:  Full (Time, Place, and Person)  Thought Content:  Illogical  Suicidal Thoughts:  No  Homicidal Thoughts:  No  Memory:  Immediate;   Fair Recent;   Fair Remote;   Fair  Judgement:  Impaired  Insight:  Shallow  Psychomotor Activity:  Normal  Concentration:  Fair   Recall:  [place] of Knowledge:Fair  Language: Fair  Akathisia:  No  Handed:  Right  AIMS (if indicated):     Assets:  Desire for Improvement Resilience  Sleep:  Number of Hours: 7.5  Cognition: Impaired,  Mild  ADL's:  Intact    Have you used any form of tobacco in the last 30 days? (Cigarettes, Smokeless Tobacco, Cigars, and/or Pipes): No  Has this patient used any form of tobacco in the last 30 days? (Cigarettes, Smokeless Tobacco, Cigars, and/or Pipes) Yes, No  Blood Alcohol level:  Lab Results  Component Value Date   ETH 41 (H) 08/05/2018   ETH <10 123XX123    Metabolic Disorder Labs:  Lab Results  Component Value Date   HGBA1C 6.1 (H) 07/30/2018   MPG 128.37 07/30/2018   No results found for: PROLACTIN Lab Results  Component Value Date   CHOL 184 07/30/2018  TRIG 144 07/30/2018   HDL 43 07/30/2018   CHOLHDL 4.3 07/30/2018   VLDL 29 07/30/2018   LDLCALC 112 (H) 07/30/2018    See Psychiatric Specialty Exam and Suicide Risk Assessment completed by Attending Physician prior to discharge.  Discharge destination:  Home  Is patient on multiple antipsychotic therapies at discharge:  No   Has Patient had three or more failed trials of antipsychotic monotherapy by history:  No  Recommended Plan for Multiple Antipsychotic Therapies: NA  Discharge Instructions    Diet - low sodium heart healthy   Complete by: As directed    Increase activity slowly   Complete by: As directed      Allergies as of 01/13/2019      Reactions   Acetaminophen Nausea And Vomiting   Morphine Nausea And Vomiting   Morphine And Related Nausea And Vomiting   (Per patient's other chart in Epic- "Cali Golson," MRN [id number])   Tylenol [acetaminophen] Nausea And Vomiting   (Per patient's other chart in Epic- "Traylon Ottens," MRN [id number])   Morphine And Related Nausea Only      Medication List    STOP taking these medications   diclofenac sodium 1 %  Gel Commonly known as: VOLTAREN   divalproex 500 MG DR tablet Commonly known as: DEPAKOTE   haloperidol 10 MG tablet Commonly known as: HALDOL   haloperidol decanoate 100 MG/ML injection Commonly known as: HALDOL DECANOATE   propranolol 40 MG tablet Commonly known as: INDERAL     TAKE these medications     Indication  benztropine 1 MG tablet Commonly known as: COGENTIN Take 1 tablet (1 mg total) by mouth at bedtime. What changed: when to take this  Indication: Extrapyramidal Reaction caused by Medications   OLANZapine 15 MG tablet Commonly known as: ZYPREXA Take 1 tablet (15 mg total) by mouth 2 (two) times daily.  Indication: Schizophrenia   traZODone 50 MG tablet Commonly known as: DESYREL Take 1 tablet (50 mg total) by mouth at bedtime as needed for sleep.  Indication: [place], [place] [facility]. Go on 01/17/2019.   Why: You have an appointment on Monday, November 16th at 930am. Please bring proof of income, photo ID, social security card, hospital discharge paperwork. Thank you. Contact information: [place] [employer] [address] [id number]           Follow-up recommendations:  Continue activity as tolerated. Continue diet as recommended by your PCP. Ensure to keep all appointments with outpatient providers.  Comments:  Patient is instructed prior to discharge to: Take all medications as prescribed by his/her mental healthcare provider. Report any adverse effects and or reactions from the medicines to his/her outpatient provider promptly. Patient has been instructed & cautioned: To not engage in alcohol and or illegal drug use while on prescription medicines. In the event of worsening symptoms, patient is instructed to call the crisis hotline, 911 and or go to the nearest ED for appropriate evaluation and treatment of symptoms. To follow-up with his/her primary care provider for your other medical  issues, concerns and or health care needs.    Signed: [place], FNP 01/13/2019, 9:36 AM

## 2019-01-13 NOTE — BHH Suicide Risk Assessment (Signed)
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   HENT: Negative.   Eyes: Negative.   Respiratory: Negative.   Cardiovascular: Negative.   Gastrointestinal: Negative.   Musculoskeletal: Negative.   Skin: Negative.   Neurological: Negative.   Psychiatric/Behavioral: Negative.     Blood pressure 124/78, pulse 100, temperature 98.7 F (37.1 C), temperature source Oral, resp. rate 18, height 5\' 7"  (1.702 m), weight 64.9 kg, SpO2 97 %.Body mass index is 22.4 kg/m.  General Appearance: Casual  Eye Contact::  Good  Speech:  Clear and Coherent409  Volume:  Normal  Mood:  Euthymic  Affect:  Inappropriate  Thought Process:  Coherent  Orientation:  Full (Time, Place, and Person)  Thought Content:  Illogical  Suicidal Thoughts:  No  Homicidal Thoughts:  No  Memory:  Immediate;   Fair Recent;   Fair Remote;   Fair  Judgement:  Impaired  Insight:  Shallow  Psychomotor Activity:  Normal  Concentration:  Fair  Recall:  [employer] of Knowledge:Fair  Language: Fair  Akathisia:  No  Handed:  Right  AIMS (if indicated):     Assets:  Desire for Improvement Resilience  Sleep:  Number of Hours: 7.5  Cognition: Impaired,  Mild  ADL's:  Intact   Mental Status Per Nursing Assessment::   On Admission:  NA  Demographic Factors:  Male, Low socioeconomic status, Living alone and Unemployed  Loss Factors: Financial problems/change in socioeconomic  status  Historical Factors: Impulsivity  Risk Reduction Factors:   Sense of responsibility to family, Religious beliefs about death and Positive social support  Continued Clinical Symptoms:  Alcohol/Substance Abuse/Dependencies Schizophrenia:   Paranoid or undifferentiated type  Cognitive Features That Contribute To Risk:  None    Suicide Risk:  Minimal: No identifiable suicidal ideation.  Patients presenting with no risk factors but with morbid ruminations; may be classified as minimal risk based on the severity of the depressive symptoms  Follow-up [place], [place]. Go on 01/17/2019.   Why: You have an appointment on Monday, November 16th at 930am. Please bring proof of income, photo ID, social security card, hospital discharge paperwork. Thank you. Contact information: [place] [address] [id number]           Plan Of Care/Follow-up recommendations:  Activity:  Activity as tolerated Diet:  Regular diet Other:  Follow-up with day [facility] services.  Please stop abusing methamphetamine and continue with medication management.  Alethia Berthold, MD 01/13/2019, 11:51 AM

## 2019-01-13 NOTE — Progress Notes (Signed)
Recreation Therapy Notes  INPATIENT RECREATION TR PLAN  Patient Details Name: Randall [name] MRN: [id number] DOB: Jun 11, 1965 Today's Date: 01/13/2019  Rec Therapy Plan Is patient appropriate for Therapeutic Recreation?: Yes Treatment times per week: at least 3 Estimated Length of Stay: 5-7 days TR Treatment/Interventions: Group participation (Comment)  Discharge Criteria Pt will be discharged from therapy if:: Discharged Treatment plan/goals/alternatives discussed and agreed upon by:: Patient/family  Discharge Summary Short term goals set: Patient will engage in groups without prompting or encouragement from LRT x3 group sessions within 5 recreation therapy group sessions Short term goals met: Not met Progress toward goals comments: Groups attended Which groups?: Other (Comment)(Emotions) Reason goals not met: Patient spent most of his time in his room Therapeutic equipment acquired: N/A Reason patient discharged from therapy: Discharge from hospital Pt/family agrees with progress & goals achieved: Yes Date patient discharged from therapy: 01/13/19   Palmyra Rogacki 01/13/2019, 11:36 AM

## 2019-01-13 NOTE — Plan of Care (Signed)
  Problem: Group Participation Goal: STG - Patient will engage in groups without prompting or encouragement from LRT x3 group sessions within 5 recreation therapy group sessions Description: STG - Patient will engage in groups without prompting or encouragement from LRT x3 group sessions within 5 recreation therapy group sessions 01/13/2019 1144 by Ernest Haber, LRT Outcome: Not Applicable [id number] by Ernest Haber, LRT Outcome: Not Met (add Reason) Note: Patient spent most of his time in his room.

## 2019-01-13 NOTE — Plan of Care (Signed)
Patient pleasant with this Probation officer, denying SI/HI/AVH  Problem: Education: Goal: Emotional status will improve Outcome: Progressing Goal: Mental status will improve Outcome: Progressing

## 2019-01-13 NOTE — Progress Notes (Signed)
D: Patient is aware of  Discharge this shift .  A:Patient denies suicidal /homicidal ideations. Patient received all belongings brought in   No Storage medications. Writer reviewed Discharge Summary, Suicide Risk Assessment, and Transitional Record. Patient also received Prescriptions   from  MD. Aware  Of follow up appointment .  R: Patient left unit with no questions  Or concerns  With family

## 2019-05-31 DIAGNOSIS — C9210 Chronic myeloid leukemia, BCR/ABL-positive, not having achieved remission: Principal | ICD-10-CM

## 2019-06-06 ENCOUNTER — Encounter: Admit: 2019-06-06 | Discharge: 2019-06-06 | Payer: MEDICAID | Attending: Hematology | Primary: Hematology

## 2019-06-06 DIAGNOSIS — C9210 Chronic myeloid leukemia, BCR/ABL-positive, not having achieved remission: Principal | ICD-10-CM

## 2019-06-22 DIAGNOSIS — C9210 Chronic myeloid leukemia, BCR/ABL-positive, not having achieved remission: Principal | ICD-10-CM

## 2019-06-23 ENCOUNTER — Other Ambulatory Visit: Admit: 2019-06-23 | Discharge: 2019-06-24 | Payer: MEDICAID

## 2019-06-23 ENCOUNTER — Ambulatory Visit: Admit: 2019-06-23 | Discharge: 2019-06-24 | Payer: MEDICAID | Attending: Hematology | Primary: Hematology

## 2019-06-23 DIAGNOSIS — C9210 Chronic myeloid leukemia, BCR/ABL-positive, not having achieved remission: Principal | ICD-10-CM

## 2019-07-05 DIAGNOSIS — C9210 Chronic myeloid leukemia, BCR/ABL-positive, not having achieved remission: Principal | ICD-10-CM

## 2019-07-05 MED ORDER — DASATINIB 100 MG TABLET
ORAL_TABLET | Freq: Every day | ORAL | 5 refills | 30.00000 days | Status: CP
Start: 2019-07-05 — End: ?
  Filled 2019-07-28: qty 30, 30d supply, fill #0

## 2019-07-06 NOTE — Unmapped (Signed)
Called all 3 #s given to me by [facility]:    [phone] (listed as home #) - male answered and stated this was a wrong #  [phone] - straight to VM, individual identifies self as Janyth Pupa on VM, no message left  [phone] (listed as cell #) - rings busy

## 2019-07-06 NOTE — Unmapped (Signed)
[facility] Specialty Medication Onboarding    Specialty Medication: Sprycel  Prior Authorization: Not Required   Financial Assistance: No - copay  <$25  Final Copay/Day Supply: $0 / 30    Insurance Restrictions: None     Notes to Pharmacist: PA was likely approved prior to script coming to [facility]    The triage team has completed the benefits investigation and has determined that the patient is able to fill this medication at [facility]. Please contact the patient to complete the onboarding or follow up with the prescribing physician as needed.

## 2019-07-06 NOTE — Unmapped (Addendum)
[facility]   Patient Onboarding/Medication Counseling    Sending patient 1 month supply for now.  Refills will be shipped only if appointment with Dr. Senaida Ores 06/17 is completed.    The patient declined counseling on missed dose instructions, goals of therapy, side effects and monitoring parameters, warnings and precautions and storage, handling precautions, and disposal because they have taken the medication previously. The information in the declined sections below are for informational purposes only and was not discussed with patient.     Mr.Jerry Coleman is a 54 y.o. male with CML who I am counseling today on initiation of therapy.  I am speaking to the patient.    Was a Nurse, learning disability used for this call? No    Verified patient's date of birth / HIPAA.    Specialty medication(s) to be sent: Hematology/Oncology: Sprycel 100mg       Non-specialty medications/supplies to be sent: none      Medications not needed at this time: none         Sprycel (dasatinib)    Medication & Administration     Dosage: Take 1 tablet (100 mg total) by mouth daily.    Administration:   ??? Administer orally at the same time each day.   ??? May be administered with or without food.  Take with food if GI upset occurs.    ??? Swallow tablet whole; do not break, crush, or chew.  ??? Avoid acid reducing medications (such as proton pump inhibitors and H2 blockers) unless directed to take by your oncology team. If antacids are used (ie TUMS) you must separate by at least 2 hours before and 2 hours after dasatinib dose.     Adherence/Missed dose instructions: If you miss a dose of medication, skip the missed dose and go back to your normal time. Do not take 2 doses at the same time or extra doses.    Goals of Therapy     ??? To prevent disease progression    Side Effects & Monitoring Parameters     Commonly reported side effects  ??? Lack of appetite  ??? Muscle pain, joint pain  ??? Painful extremities   ??? Constipation  ??? Headache    The following side effects should be reported to the provider:  ??? Signs of infection (fever >100.4, chills, mouth sores, sputum production)  ??? Signs of low phosphate (vision changes, confusion, mood changes, muscle pain, muscle weakness, trouble breathing or trouble swallowing).  ??? Signs of Steven-Johnson syndrome/toxic epidermal necrolysis (red, swollen, blistered or peeling skin (with or without fever), red or irritated eyes, or sores in the mouth, throat, nose or eyes.  ??? Signs of tumor lysis syndrome (fast or abnormal heartbeat, any passing out, unable to pass urine, muscle weakness or cramps, nausea, vomiting, diarrhea or lack of appetite or feeling sluggish)  ??? Shortness of breath, trouble breathing, swelling of arms or legs, weight gain  ??? Chest pain, fast or abnormal heartbeat, passing out, severe dizziness  ??? Signs of bleeding (vomiting or coughing up blood, blood that looks like coffee grounds, blood in the urine or black, red tarry stools, bruising that gets bigger without reason, any persistent or severe bleeding, impaired wound healing)  ??? Severe abdominal pain, severe nausea, vomiting, severe diarrhea  ??? Severe loss of energy and strength  ??? Numbness or tingling of hands or feet  ??? Seizure   ??? Signs of anaphylaxis (wheezing, chest tightness, swelling of face, lips, tongue or throat)  Monitoring  Parameters:   ??? CBC with differential   ??? Bone marrow biopsy; liver function tests, electrolytes including calcium, phosphorus, magnesium  ??? Monitor for fluid retention  ??? Monitor for signs/symptoms of cardiac dysfunction; ECG monitoring if at risk for QTc prolongation; chest x-ray is recommended for symptoms suggestive of pleural effusion   ??? Monitor signs/symptoms of tumor lysis syndrome and dermatologic reactions.   ??? Monitor bone growth/development in pediatric patients.   ??? Monitor blood pressure routinely  ??? Monitor adherence.  ??? Thyroid function testing recommendations  o Preexisting levothyroxine therapy: Obtain baseline TSH levels, then monitor every 4 weeks until levels and levothyroxine dose are stable, then monitor every 2 months  o Without preexisting thyroid hormone replacement: TSH at baseline, then monthly for 4 months, then every 2 to 3 months  Contraindications, Warnings, & Precautions     ??? Bone marrow suppression: Severe dose-related bone marrow suppression (thrombocytopenia, neutropenia, anemia) is associated with dasatinib treatment. Hematologic toxicity is usually reversible with dosage adjustment and/or temporary treatment interruption. The incidence of myelosuppression is higher in patients with advanced chronic myeloid leukemia (CML) and Ph+ acute lymphoblastic leukemia (ALL).  ??? Cardiovascular adverse events: Dasatinib may cause cardiac dysfunction; cardiac ischemic events, cardiac fluid retention-related events, and conduction abnormalities (arrhythmia and palpitations) have been reported.   ??? Dermatologic toxicity: Cases of severe mucocutaneous dermatologic reactions (including Stevens-Johnson syndrome and erythema multiforme) have been reported with dasatinib.  ??? Fluid retention: Dasatinib may cause fluid retention, including pleural and pericardial effusions, pulmonary hypertension, and generalized or superficial edema. A prompt chest x-ray (or other appropriate diagnostic imaging) is recommended for symptoms suggestive of effusion (new or worsening dyspnea on exertion or at rest, pleuritic chest pain, or dry cough). Fluid retention/pleural effusion was observed in adults and grade 1 or 2 fluid retention was observed in pediatric patients. Use with caution in patients where fluid accumulation may be poorly tolerated, such as in cardiovascular disease (HF or hypertension) and pulmonary disease.  ??? Hemorrhage: Dasatinib may cause serious and fatal bleeding, including grades 3 and higher CNS hemorrhage. The most frequent hemorrhage site was gastrointestinal. Grades 3 or 4 hemorrhage usually required treatment interruptions and transfusions. Most bleeding events in clinical studies were associated with severe thrombocytopenia, although dasatinib may also cause platelet dysfunction. Concomitant medications that inhibit platelet function or anticoagulants may increase the risk of bleeding.  ??? Pulmonary arterial hypertension: Dasatinib may increase the risk for pulmonary arterial hypertension (PAH) in both adult and pediatric patients. PAH may occur at any time after starting treatment, including after >12 months of therapy. Evaluate for underlying cardiopulmonary disease prior to therapy initiation and during therapy; evaluate and rule out alternative etiologies in patients with symptoms suggestive of PAH (eg, dyspnea, fatigue, hypoxia, fluid retention).  ??? QT prolongation: Dasatinib may increase the risk for QT interval prolongation; there are reports of patients with QTcF >500 msec. Use caution in patients at risk for QT prolongation, including patients with long QT syndrome, patients taking antiarrhythmic medications or other medications that lead to QT prolongation or potassium-wasting diuretics, patients with cumulative high-dose anthracycline therapy, and conditions which cause hypokalemia or hypomagnesemia. Correct hypokalemia and hypomagnesemia prior to and during dasatinib therapy.  ??? Tumor lysis syndrome: Tumor lysis syndrome (TLS) has been reported in patients with resistance to imatinib therapy, usually in patients with advanced phase disease. Risk for TLS is higher in patients with advanced stage disease and/or a high tumor burden; monitor patients at risk more frequently.  ??? Hepatic impairment: Use  with caution in patients with hepatic impairment due to extensive hepatic metabolism.  ??? Elderly: Patients 50 years of age and older are more likely to experience toxicity (compared with younger patients).  ??? Pediatric: Adverse reactions associated with bone growth and development have been reported in pediatric studies of chronic phase CML (including a report of severe [grade 3] growth retardation). Cases have included epiphyses delayed fusion, osteopenia, growth retardation, and gynecomastia; some cases resolved during treatment. Monitor bone growth/development in pediatric patients.  ??? Reproductive Considerations: Females of reproductive potential should use effective contraception during treatment and for 30 days after the final dasatinib dose.  ??? Breast-Feeding Considerations: It is not known if dasatinib is present in breast milk. Due to the potential for serious adverse reactions in the breastfed infant, breastfeeding is not recommended by the manufacturer during treatment and for 2 weeks following the final dasatinib dose.  Drug/Food Interactions     ??? Medication list reviewed in Epic. The patient was instructed to inform the care team before taking any new medications or supplements. Antacids may decrease the serum concentration of Dasatinib. Administer antacids 2 hours before or 2 hours after dasatinib.  Marland Kitchen   ??? Is the patient on any CYP3A4 inducers? No.  ??? Is the patient on any CYP3A4 inhibitors? No.  ??? Is the patient taking an anticoagulant? No.  ??? Is the patient taking a medication that could interfere with platelet function? No.  ??? Is the patient taking at QTc-prolonging medication? No.  ??? Is the patient taking a PPI, H2 blocker or any medicine that affects gastric pH? Yes, Magnesium hydroxide.  ??? Avoid grapefruit and grapefruit juice as they can increase serum concentration of dasatinib.  ??? Avoid St. John's Wort as it may decrease the serum concentration of dasatinib.  ??? This medication may reduce vaccine efficacy. Do not receive any vaccines without consulting your doctor.    Storage, Handling Precautions, & Disposal   ??? Store at room temperature in the original container (do not use a pillbox or store with other medications).   ??? Caregivers helping administer medication should wear gloves and wash hands immediately after.    ??? Keep the lid tightly closed. Keep out of the reach of children and pets.  ??? Do not flush down a toilet or pour down a drain unless instructed to do so.  Check with your local police department or fire station about drug take-back programs in your area.      Current Medications (including OTC/herbals), Comorbidities and Allergies     Current Outpatient Medications   Medication Sig Dispense Refill   ??? cholecalciferol, vitamin D3 25 mcg, 1,000 units,, (CHOLECALCIFEROL-25 MCG, 1,000 UNIT,) 1,000 unit (25 mcg) tablet Take 25 mcg by mouth daily.     ??? dasatinib (SPRYCEL) 100 mg tablet Take 1 tablet (100 mg total) by mouth daily. 30 tablet 5   ??? diclofenac sodium (VOLTAREN) 1 % gel Apply 2 g topically daily as needed.     ??? DULoxetine (CYMBALTA) 60 MG capsule Take 1 capsule (60 mg total) by mouth Two (2) times a day. 60 capsule 1   ??? gabapentin (NEURONTIN) 300 MG capsule Take 2 capsules (600 mg total) by mouth Three (3) times a day. 90 capsule 1   ??? haloperidol (HALDOL) 2 mg/mL solution Take 2 mg by mouth Two (2) times a day. Prn     ??? LORazepam (ATIVAN) 2 MG tablet Take 2 mg by mouth every six (6) hours as needed for anxiety.     ???  magnesium hydroxide (PHILLIPS CHEWS) 311 MG Chew Chew 311 mg every four (4) hours as needed. Prn if no BM in 3 days     ??? sodium chloride 3 % nebulizer solution Inhale 4 mL by nebulization as needed for other (Epistaxis).       No current facility-administered medications for this visit.       Allergies   Allergen Reactions   ??? Codeine    ??? Acetaminophen Nausea And Vomiting     (Per patient's other chart in Epic- Colletta Maryland, MRN [id number])     ??? Morphine Nausea And Vomiting       Patient Active Problem List   Diagnosis   ??? Leukemia (CMS-HCC)   ??? Major depressive disorder, recurrent episode, moderate (CMS-HCC)   ??? Opioid dependence with withdrawal (CMS-HCC)       Reviewed and up to date in Epic.    Appropriateness of Therapy     Is medication and dose appropriate based on diagnosis? Yes    Prescription has been clinically reviewed: Yes    Baseline Quality of Life Assessment      How many days over the past month did your CML  keep you from your normal activities? For example, brushing your teeth or getting up in the morning. 0    Financial Information     Medication Assistance provided: None Required    Anticipated copay of $0 / 30 days reviewed with patient. Verified delivery address.    Delivery Information     Scheduled delivery date: 07/29/19    Expected start date: 07/29/19    Medication will be delivered via UPS to the prescription address in [facility].  This shipment will not require a signature.      Explained the services we provide at [facility] and that each month we would call to set up refills.  Stressed importance of returning phone calls so that we could ensure they receive their medications in time each month.  Informed patient that we should be setting up refills 7-10 days prior to when they will run out of medication.  A pharmacist will reach out to perform a clinical assessment periodically.  Informed patient that a welcome packet and a drug information handout will be sent.      Patient verbalized understanding of the above information as well as how to contact the pharmacy at [phone] option 4 with any questions/concerns.  The pharmacy is open Monday through Friday 8:30am-4:30pm.  A pharmacist is available 24/7 via pager to answer any clinical questions they may have.    Patient Specific Needs     - Does the patient have any physical, cognitive, or cultural barriers? No    - Patient prefers to have medications discussed with  Patient     - Is the patient or caregiver able to read and understand education materials at a high school level or above? Yes    - Patient's primary language is  English     - Is the patient high risk? Yes, patient is taking oral chemotherapy. Appropriateness of therapy has been assessed.     - Does the patient require a Care Management Plan? No     - Does the patient require physician intervention or other additional services (i.e. nutrition, smoking cessation, social work)? No      Trichelle Lehan [facility] Specialty Pharmacist

## 2019-07-07 NOTE — Unmapped (Signed)
Our team has been unable to contact this patient at any available phone #, he has not contacted our clinic, and did not show up to his clinic appointments today.  I mailed him a letter (see copy under Letters tab) to the address he provided in clinic last time to encourage him to reach out to our clinic to r/s.

## 2019-07-27 NOTE — Unmapped (Signed)
Hi,     Patient contacted the Communication Center requesting to speak with the care team of Jerry Coleman to discuss:    Would like to know if Dr. Senaida Ores will refer him to an oncologist close to his home.I've been without my medicine for a long time. I want to know if I can see a doctor closer to my home so I can finish treatment.    Please contact patient at [phone].    Check Indicates criteria has been reviewed and confirmed with the patient:    [x]  Preferred Name   [x]  DOB and/or MR#  [x]  Preferred Contact Method  [x]  Phone Number(s)   []  MyChart     Thank you,   Laverna Peace  [facility]   [phone]

## 2019-07-27 NOTE — Unmapped (Signed)
I returned Mr. Frith call regarding rescheduling his 5/6 missed visit. He had also reached out to [facility] to deliver dasatinib. The listed phone number is his friend's number, but he is staying with him and the address has been updated to reflect that. Since he has not re-established with [place] after his prison release, I would like to talk to him to ensure he can make a future appointment if scheduled. We will not refill subsequent months without seeing him soon.    In talking to the patient, he has in fact been off dasatinib since release as expected, therefore missing about 3 weeks of medication. Currently reports having some stomach aches, denies fever. He has not established PCP or psych care, but plans to do so. When he was taking Sprycel he reports that he struggled with fatigue, mood changes, nausea, minimal appetite, some SOB (reports never gotten a CXR), no issues with skin rashes. Overall doesn't feel well on Sprycel but understands the importance of taking his medication, I emphasized importance of adherence (last BCR-ABL 27.4% from 4/22; negative for mutations.)     Plan:  - Dasatinib 100 mg daily #30 sent from [facility] to patient (at friend's address in Epic). Further fills should be held until patient seen in-person  - Patient agreed to come to appointments if Medicaid transportation is arranged.   - Patient currently does not have any other medications and since he hasn't re-established since release, we may have to send prescriptions in the meantime. I recommend doing so at his upcoming in person visit.   - Social work involved.    Manfred Arch, PharmD, BCOP, CPP  Pager: [phone]

## 2019-07-27 NOTE — Unmapped (Signed)
Hi,     Patient contacted the Communication Center regarding the following:    - Would like to reschedule missed appointments from 07/07/19.    Please contact patient at [phone].    Thanks in advance,    Jerry Coleman  [facility] Communication Center   [phone]

## 2019-07-28 MED FILL — SPRYCEL 100 MG TABLET: 30 days supply | Qty: 30 | Fill #0 | Status: AC

## 2019-07-28 NOTE — Unmapped (Signed)
[facility]  Oncology Outpatient Social Work        Referring Provider:   Kendal Hymen, CPP    Reason for Referral:  Transportation and other psychosocial needs    Encounter Type:  T/C attempted to patient. No answer and no option to leave VM msg. Will keep trying.   T/C to [employer]. social services for transportation coordination    Oncology Treatment Status:  Jerry Coleman is a 54 y.o. male with past medical history of CML (dx 2008), schizophrenic spectrum disorder, major depressive disorder, amphetamine abuse, opioid use disorder, and vit D def., who was off of dasatinib for many months due to psychosis. Provider has concerns about patient's long-term ability to continue to stay on the medication due to his history of homelessness and incarceration.   ??  Plan is to restart oral chemo - tyrosine kinase inhibitor (dasatnib).    Psychosocial Status and Case Management:  Pt is recently released from prison, homeless, and currently staying with a friend in [place], [place].  He has a history of MDD and opioid dependence with withdrawal, who was recently released from incarceration, after a stay at the state [facility].  Pt reports to provider team that he needs transportation assistance, and has no one who can drive him to cancer center.    T/C to [facility] that Pt has Medicaid in [facility]  T/C to [facility], [phone]), who states they only send their Zenaida Niece over to [facility] on Fridays, for appts that start between 9am-11am. She referred me to Ezequiel Kayser [phone]), who was able to set patient up with a foster care driver for his upcoming appt on 08/18/19.  Pt will need to be ready by 7:30am for driver to pick him up.    Sent in-basket msg to provider team Leonard Schwartz, MD, Wynona Meals, RN Navigator, and [employer], CPP) with transportation plan.    Follow-Up Plan:  Will continue to try reaching patient in order to discuss transportation plan for upcoming appt on 08/18/19.  Pt has this SW'ers contact information and will reach out for psychosocial assistance as needed.     Claris Gladden, OSW-C  Oncology Outpatient Social Worker  Phone: [phone]

## 2019-07-28 NOTE — Unmapped (Signed)
Here you go [address]

## 2019-07-28 NOTE — Unmapped (Signed)
Looping in Dr Berenda Morale scheduler, Molli Hazard

## 2019-07-29 NOTE — Unmapped (Signed)
Completed.

## 2019-07-29 NOTE — Unmapped (Signed)
Scheduled for labs and message sent to onc staffing

## 2019-08-02 NOTE — Unmapped (Signed)
[facility]  Oncology Outpatient Social Work      Reason for Contact:  Follow-up with Pt on Medicaid transportation plan    Psychosocial Update and Case Management:  T/C to Pt.  Informed him of plan for [facility], [phone]) and Ezequiel Kayser [phone]) to send over a  foster care driver to pick him up for his upcoming appt on 08/18/19.  Informed Pt that he will need to be ready by 7:30am for driver to pick him up, and provided him with above contact information if he needs to get ahold of them for any reason.     Follow-Up Plan:  Pt has this SW'ers contact information and will reach out for psychosocial assistance as needed.     Claris Gladden, OSW-C  Oncology Outpatient Social Worker  Phone: [phone]

## 2019-08-09 DIAGNOSIS — S0003XA Contusion of scalp, initial encounter: Secondary | ICD-10-CM | POA: Diagnosis not present

## 2019-08-09 DIAGNOSIS — X58XXXA Exposure to other specified factors, initial encounter: Secondary | ICD-10-CM | POA: Diagnosis not present

## 2019-08-09 DIAGNOSIS — R442 Other hallucinations: Secondary | ICD-10-CM | POA: Diagnosis not present

## 2019-08-09 DIAGNOSIS — F22 Delusional disorders: Secondary | ICD-10-CM | POA: Diagnosis not present

## 2019-08-09 DIAGNOSIS — R1084 Generalized abdominal pain: Secondary | ICD-10-CM | POA: Diagnosis not present

## 2019-08-09 DIAGNOSIS — R45851 Suicidal ideations: Secondary | ICD-10-CM | POA: Diagnosis not present

## 2019-08-09 DIAGNOSIS — F28 Other psychotic disorder not due to a substance or known physiological condition: Secondary | ICD-10-CM | POA: Diagnosis not present

## 2019-08-09 DIAGNOSIS — F1520 Other stimulant dependence, uncomplicated: Secondary | ICD-10-CM | POA: Diagnosis not present

## 2019-08-09 DIAGNOSIS — I672 Cerebral atherosclerosis: Secondary | ICD-10-CM | POA: Diagnosis not present

## 2019-08-09 DIAGNOSIS — M549 Dorsalgia, unspecified: Secondary | ICD-10-CM | POA: Diagnosis not present

## 2019-08-09 DIAGNOSIS — R4689 Other symptoms and signs involving appearance and behavior: Secondary | ICD-10-CM | POA: Diagnosis not present

## 2019-08-09 DIAGNOSIS — Z23 Encounter for immunization: Secondary | ICD-10-CM | POA: Diagnosis not present

## 2019-08-09 DIAGNOSIS — Y999 Unspecified external cause status: Secondary | ICD-10-CM | POA: Diagnosis not present

## 2019-08-09 DIAGNOSIS — F1220 Cannabis dependence, uncomplicated: Secondary | ICD-10-CM | POA: Diagnosis not present

## 2019-08-09 DIAGNOSIS — R4182 Altered mental status, unspecified: Secondary | ICD-10-CM | POA: Diagnosis not present

## 2019-08-09 DIAGNOSIS — R404 Transient alteration of awareness: Secondary | ICD-10-CM | POA: Diagnosis not present

## 2019-08-09 DIAGNOSIS — F29 Unspecified psychosis not due to a substance or known physiological condition: Secondary | ICD-10-CM | POA: Diagnosis not present

## 2019-08-09 DIAGNOSIS — F15221 Other stimulant dependence with intoxication delirium: Secondary | ICD-10-CM | POA: Diagnosis not present

## 2019-08-09 DIAGNOSIS — F15259 Other stimulant dependence with stimulant-induced psychotic disorder, unspecified: Secondary | ICD-10-CM | POA: Diagnosis not present

## 2019-08-09 DIAGNOSIS — R000 Tachycardia, unspecified: Secondary | ICD-10-CM | POA: Diagnosis not present

## 2019-08-10 DIAGNOSIS — F22 Delusional disorders: Secondary | ICD-10-CM | POA: Diagnosis not present

## 2019-08-10 DIAGNOSIS — R1084 Generalized abdominal pain: Secondary | ICD-10-CM | POA: Diagnosis not present

## 2019-08-10 DIAGNOSIS — F15221 Other stimulant dependence with intoxication delirium: Secondary | ICD-10-CM | POA: Diagnosis not present

## 2019-08-10 DIAGNOSIS — F15259 Other stimulant dependence with stimulant-induced psychotic disorder, unspecified: Secondary | ICD-10-CM | POA: Diagnosis not present

## 2019-08-10 DIAGNOSIS — F28 Other psychotic disorder not due to a substance or known physiological condition: Secondary | ICD-10-CM | POA: Diagnosis not present

## 2019-08-11 DIAGNOSIS — R4689 Other symptoms and signs involving appearance and behavior: Secondary | ICD-10-CM | POA: Diagnosis not present

## 2019-08-11 DIAGNOSIS — I672 Cerebral atherosclerosis: Secondary | ICD-10-CM | POA: Diagnosis not present

## 2019-08-11 DIAGNOSIS — R4182 Altered mental status, unspecified: Secondary | ICD-10-CM | POA: Diagnosis not present

## 2019-08-11 DIAGNOSIS — S0003XA Contusion of scalp, initial encounter: Secondary | ICD-10-CM | POA: Diagnosis not present

## 2019-08-11 DIAGNOSIS — Y999 Unspecified external cause status: Secondary | ICD-10-CM | POA: Diagnosis not present

## 2019-08-11 DIAGNOSIS — X58XXXA Exposure to other specified factors, initial encounter: Secondary | ICD-10-CM | POA: Diagnosis not present
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
## 2019-08-18 ENCOUNTER — Ambulatory Visit
Admit: 2019-08-18 | Discharge: 2019-09-01 | Disposition: A | Discharge status: Home / Self Care | Payer: MEDICAID | Source: Ambulatory Visit | Admitting: Psychiatry

## 2019-08-18 ENCOUNTER — Encounter: Admit: 2019-08-18 | Discharge: 2019-08-18 | Payer: MEDICAID

## 2019-08-18 ENCOUNTER — Ambulatory Visit: Admit: 2019-08-18 | Discharge: 2019-08-18 | Payer: MEDICAID | Attending: Hematology | Primary: Hematology

## 2019-08-18 ENCOUNTER — Ambulatory Visit: Admit: 2019-08-18 | Discharge: 2019-08-18 | Payer: MEDICAID | Attending: Pharmacist | Primary: Pharmacist

## 2019-08-18 DIAGNOSIS — F29 Unspecified psychosis not due to a substance or known physiological condition: Secondary | ICD-10-CM | POA: Diagnosis not present

## 2019-08-18 DIAGNOSIS — C9210 Chronic myeloid leukemia, BCR/ABL-positive, not having achieved remission: Secondary | ICD-10-CM | POA: Diagnosis not present

## 2019-08-18 DIAGNOSIS — Z20822 Contact with and (suspected) exposure to covid-19: Secondary | ICD-10-CM | POA: Diagnosis not present

## 2019-08-18 DIAGNOSIS — B182 Chronic viral hepatitis C: Secondary | ICD-10-CM | POA: Diagnosis not present

## 2019-08-18 DIAGNOSIS — C9390 Monocytic leukemia, unspecified, not having achieved remission: Secondary | ICD-10-CM | POA: Diagnosis not present

## 2019-08-18 DIAGNOSIS — F209 Schizophrenia, unspecified: Secondary | ICD-10-CM | POA: Diagnosis not present

## 2019-08-18 LAB — ALKALINE PHOSPHATASE: Alkaline phosphatase:CCnc:Pt:Ser/Plas:Qn:: 110

## 2019-08-18 LAB — CBC W/ AUTO DIFF
BASOPHILS ABSOLUTE COUNT: 0.2 10*9/L — ABNORMAL HIGH (ref 0.0–0.1)
BASOPHILS RELATIVE PERCENT: 1.4 %
EOSINOPHILS ABSOLUTE COUNT: 0.6 10*9/L — ABNORMAL HIGH (ref 0.0–0.4)
EOSINOPHILS RELATIVE PERCENT: 5.2 %
HEMATOCRIT: 42.9 % (ref 41.0–53.0)
HEMOGLOBIN: 14.7 g/dL (ref 13.5–17.5)
LARGE UNSTAINED CELLS: 2 % (ref 0–4)
LYMPHOCYTES ABSOLUTE COUNT: 3.6 10*9/L (ref 1.5–5.0)
LYMPHOCYTES RELATIVE PERCENT: 32.6 %
MEAN CORPUSCULAR HEMOGLOBIN CONC: 34.4 g/dL (ref 31.0–37.0)
MEAN CORPUSCULAR HEMOGLOBIN: 31.8 pg (ref 26.0–34.0)
MEAN CORPUSCULAR VOLUME: 92.5 fL (ref 80.0–100.0)
MEAN PLATELET VOLUME: 7.8 fL (ref 7.0–10.0)
MONOCYTES ABSOLUTE COUNT: 1.4 10*9/L — ABNORMAL HIGH (ref 0.2–0.8)
MONOCYTES RELATIVE PERCENT: 12.5 %
NEUTROPHILS ABSOLUTE COUNT: 5.2 10*9/L (ref 2.0–7.5)
NEUTROPHILS RELATIVE PERCENT: 46.2 %
PLATELET COUNT: 365 10*9/L (ref 150–440)
RED BLOOD CELL COUNT: 4.63 10*12/L (ref 4.50–5.90)
RED CELL DISTRIBUTION WIDTH: 15.3 % — ABNORMAL HIGH (ref 12.0–15.0)

## 2019-08-18 LAB — MEAN CORPUSCULAR VOLUME: Erythrocyte mean corpuscular volume:EntVol:Pt:RBC:Qn:Automated count: 92.5

## 2019-08-18 LAB — COMPREHENSIVE METABOLIC PANEL
ALKALINE PHOSPHATASE: 110 U/L (ref 38–126)
ALT (SGPT): 31 U/L (ref <50)
ANION GAP: 8 mmol/L (ref 7–15)
AST (SGOT): 44 U/L (ref 19–55)
BILIRUBIN TOTAL: 0.4 mg/dL (ref 0.0–1.2)
BLOOD UREA NITROGEN: 11 mg/dL (ref 7–21)
BUN / CREAT RATIO: 14
CHLORIDE: 106 mmol/L (ref 98–107)
CO2: 25 mmol/L (ref 22.0–30.0)
CREATININE: 0.78 mg/dL (ref 0.70–1.30)
EGFR CKD-EPI AA MALE: >90 mL/min/{1.73_m2} (ref >=60)
EGFR CKD-EPI NON-AA MALE: >90 mL/min/{1.73_m2} (ref >=60)
GLUCOSE RANDOM: 121 mg/dL (ref 70–179)
POTASSIUM: 4 mmol/L (ref 3.5–5.0)
PROTEIN TOTAL: 7.9 g/dL (ref 6.5–8.3)

## 2019-08-18 MED ADMIN — LORazepam (ATIVAN) injection 2 mg: 2 mg | INTRAMUSCULAR | @ 18:00:00 | Stop: 2019-08-18

## 2019-08-18 MED ADMIN — haloperidol lactate (HALDOL) injection 5 mg: 5 mg | INTRAMUSCULAR | @ 18:00:00 | Stop: 2019-08-18

## 2019-08-18 NOTE — Unmapped (Signed)
Bed: G113  Expected date:   Expected time:   Means of arrival:   Comments:  Police holding

## 2019-08-18 NOTE — Unmapped (Signed)
[facility]  Oncology Outpatient Social Work      Reason for Contact:  Transportation    Psychosocial Update and Case Management:  Pt arrived at clinic appt via [facility] transportation.  This is his first outpt clinic appt since he was discharged from incarceration several weeks ago.    Met with patient prior to entry of provider team.   He is wearing sunglasses, his behavior is withdrawn, his speech is very soft and he is slow to respond to questions with this SW'er having to ask multiple times to gain a response, making communication somewhat difficult.   Pt only communicates in Spanish today, which reportedly is a change from previously when he preferred [place], per Wynona Meals, Heritage manager.    Pt is homeless and is staying with a friend in [place].   Pt's chart indicates that he went to ER on 08/09/19 and 08/10/19 in [place], [place], seeking a place to rest and food.   While there he complained of a minor head injury a few days prior, and was cleared medically.   Of note, he presented with some hallucinations in ER, with a positive UA for methamphetamines and THC.    Ultimately, patient produced a slip from Medicaid for this SW'er to sign that he attended his appt, but he was not interested in other SW assistance with housing or other psychosocial needs.  Consulted with Wynona Meals, RN Navigator and Resident meeting with patient today along with [name] Fiscal, MD. Informed them of patient's behavior and speech changes.  They are working on getting him connected to a PCP, and will potentially make a referral to [facility] psychiatry for assistance with patient's hx of severe and persistent mental illness as well as current substance abuse.     Follow-Up Plan:  Pt has this SW'ers contact information and will reach out for psychosocial assistance as needed.     Claris Gladden, OSW-C  Oncology Outpatient Social Worker  Phone: [phone]

## 2019-08-18 NOTE — Unmapped (Signed)
Pt asked what brings him to ED states  The devil when asked why he states  I don't know Pt not answering questions appropriately asked who is the devil states all of you english speaking people. Pt asked about SI and HI states of course why not denies plan when asked about pain states My dick hurts

## 2019-08-18 NOTE — Unmapped (Shared)
Emergency Department Provider Note    ED Clinical Impression     Final diagnoses:   Psychosis, unspecified psychosis type (CMS-HCC) (Primary)     ED Course     ED Course as of Aug 17 1552   Thu Aug 18, 2019   1349 Patient continues to remain uncooperative.  Patient has active psychosis.  Speaking about the devil cursing at staff.  On chart review patient has several visits secondary to psychosis previously as well as drug use.  Patient is moving all 4 extremities.  Patient is wearing sunglasses a bandanna across half of his face and has a feather coming out of the bandanna.  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  Mentions that he will put some curses on his.  Patient is yelling at the staff.  For our safety as well as his he was provided Haldol and Ativan intramuscularly which he did not resist.  20 minutes after the medications patient still remains agitated unwilling to change his close.  Unwilling to take off his sunglasses his bandanna are empty his pockets.  Patient did have some sort of metal tool as well as to pipes on him initially.  50 mg IM Benadryl now ordered to ensure we can safely assess the patient.      1406 Patient becoming increasingly agitated and physically violent at this time.  We were not able to safely administer the Benadryl.  Patient is not tachycardic or having signs of active toxidrome.  Will provide 300 mg of ketamine intramuscularly and placed patient in restraints.      1430 At the prospect of ketamine patient was finally redirectable, voluntarily changed out of close into the psychiatry down.  We were able to transfer him safely to the psychiatry unit.  I am starting the IVC process now.  Labs still pending.         Critical Care  Performed by: Emiliano Dyer, DO  Authorized by: Emiliano Dyer, DO     Critical care provider statement:     Critical care time (minutes):  50    Critical care start time:  08/18/2019 1:30 PM    Critical care end time:  08/18/2019 3:52 PM    Critical care time was exclusive of:  Teaching time and separately billable procedures and treating other patients    Critical care was necessary to treat or prevent imminent or life-threatening deterioration of the following conditions: Psychosis with significant agitation.    Critical care was time spent personally by me on the following activities:  Re-evaluation of patient's condition, obtaining history from patient or surrogate, examination of patient, evaluation of patient's response to treatment, development of treatment plan with patient or surrogate, ordering and performing treatments and interventions and review of old charts    I assumed direction of critical care for this patient from another provider in my specialty: no        History     Chief Complaint   Patient presents with   ??? Psychiatric Evaluation     HPI     Jerry Coleman is a 54 y.o. male with a past medical history of CML (first diagnosed 2013), schizophrenia, amphetamine abuse, and opioid use disorder presenting to the ED for psychiatric evaluation.  The patient was seen at [facility] this morning for evaluation of CML. He has been of of dasatinib for several months due to psychosis.  A proper evaluation was unable to be completed due to his behavior. His  oncologist sent him to the ED for psychiatric evaluation 2/2 active delusions, innapropriate gestures, and lack of proper communication with the staff upon evaluation.     On arrival to the ED, the patient is actively psychotic. He is speaking of the devil, cursing at staff, using profanity, spitting, and yelling.     History and Review of Systems Limited By: Psychiatric illness    No past medical history on file.    No past surgical history on file.    No family history on file.    Social History     Socioeconomic History   ??? Marital status: Divorced     Spouse name: None   ??? Number of children: None   ??? Years of education: None   ??? Highest education level: None   Occupational History   ??? None   Tobacco Use   ??? Smoking status: None   Substance and Sexual Activity   ??? Alcohol use: Yes     Alcohol/week: 0.0 standard drinks   ??? Drug use: Yes   ??? Sexual activity: None   Other Topics Concern   ??? None   Social History Narrative    PSYCHIATRIC HX:     -Current provider(s):      -Suicide attempts/SIB: YES, per chart review, several past attempts- last documented was May, 2020 by tying cord or bag around neck outside hospital ED.     -Psych Hospitalizations:  YES, several per chart review including [facility] in the beginning of 2021 (Transferred from [facility] to [facility] on 05/26/19). Last hospitalization at [facility] in May 2020. [facility] in July, 2016.    -Med compliance hx: Poor, fair, good     -Fa hx suicide: {PES Y/N:52669}        SUBSTANCE ABUSE HX:     -Current using substance: {PES Y/N:52669}    -Hx w/d sxs: {PES Y/N:52669}    -Sz Hx: {PES Y/N:52669}    -DT Hx:{PES Z/O:10960}        SOCIAL HX:    -Current living environment: Reportedly living with a friend in [place] ***    -Current support:***    -Violence (perp): {PES A/V:40981}    -Access to Firearms: {PES X/B:14782}        -Guardian: NO        -Trauma: {PES N/F:62130}     Social Determinants of Health     Financial Resource Strain:    ??? Difficulty of Paying Living Expenses:    Food Insecurity:    ??? Worried About Programme researcher, broadcasting/film/video in the Last Year:    ??? Barista in the Last Year:    Transportation Needs:    ??? Freight forwarder (Medical):    ??? Lack of Transportation (Non-Medical):    Physical Activity:    ??? Days of Exercise per Week:    ??? Minutes of Exercise per Session:    Stress:    ??? Feeling of Stress :    Social Connections:    ??? Frequency of Communication with Friends and Family:    ??? Frequency of Social Gatherings with Friends and Family:    ??? Attends Religious Services:    ??? Database administrator or Organizations:    ??? Attends Engineer, structural:    ??? Marital Status:      Review of Systems Constitutional: Negative for activity change, chills and fever.   HENT: Negative for congestion.    Respiratory: Negative  for cough, shortness of breath and wheezing.    Cardiovascular: Negative for chest pain and leg swelling.   Gastrointestinal: Negative for abdominal pain, diarrhea, nausea and vomiting.   Genitourinary: Negative for dysuria, flank pain and urgency.   Musculoskeletal: Negative for myalgias.   Allergic/Immunologic: Negative for immunocompromised state.   Neurological: Negative for dizziness, syncope, weakness, numbness and headaches.   Psychiatric/Behavioral: Positive for agitation and behavioral problems. The patient is hyperactive.      Physical Exam     BP 117/91  - Pulse 93  - Temp 36.5 ??C (97.7 ??F) (Skin)  - SpO2 100%     Physical Exam  Vitals and nursing note reviewed.   Constitutional:       General: He is awake. He is not in acute distress.     Appearance: Normal appearance. He is well-developed. He is not ill-appearing, toxic-appearing or diaphoretic.   HENT:      Head: Normocephalic and atraumatic.   Eyes:      Comments: Follows past midline   Cardiovascular:      Rate and Rhythm: Normal rate.   Pulmonary:      Effort: Pulmonary effort is normal. No tachypnea, accessory muscle usage or respiratory distress.   Musculoskeletal:      Cervical back: Full passive range of motion without pain.   Neurological:      General: No focal deficit present.      Mental Status: He is alert.      GCS: GCS eye subscore is 4. GCS verbal subscore is 5. GCS motor subscore is 6.      Gait: Gait normal.   Psychiatric:         Attention and Perception: He is inattentive.         Mood and Affect: Affect is labile, angry and inappropriate.         Speech: Speech is tangential.         Behavior: Behavior is uncooperative and aggressive.         Judgment: Judgment is impulsive and inappropriate.      Comments: Actively psychotic. He is speaking of the devil, cursing at staff, using profanity, and yelling. Documentation assistance was provided by Bryna Colander, Scribe on August 18, 2019 at 1:17 PM for [facility], DO.    August 18, 2019 1:53 PM. Documentation assistance provided by the scribe. I was present during the time the encounter was recorded. The information recorded by the scribe was done at my direction and has been reviewed and validated by me.

## 2019-08-18 NOTE — Unmapped (Signed)
Pt  Has a   Blue backpack, jeans  Green t- shirt, back  Shoes  And handkerchief  All belongings  Lock in # 113

## 2019-08-18 NOTE — Unmapped (Addendum)
I am sending you to the ED to be evaluated due to your abnormal behavior. Hopefully the psychiatric team can help.     Mariel Aloe, MD  Leukemia Program       Nurse Navigator (non-clinical trial patients): Wynona Meals, RN        Tel. [phone]       Fax. [phone]  Toll-free appointments: [phone]  Scheduling assistance: [phone]  After hours/weekends: [phone] (ask for adult hematology/oncology on-call)      Lab Results   Component Value Date    WBC 11.1 (H) 08/18/2019    HGB 14.7 08/18/2019    HCT 42.9 08/18/2019    PLT 365 08/18/2019       Lab Results   Component Value Date    NA 139 08/18/2019    K 4.0 08/18/2019    CL 106 08/18/2019    CO2 25.0 08/18/2019    BUN 11 08/18/2019    CREATININE 0.78 08/18/2019    GLU 121 08/18/2019    CALCIUM 9.8 08/18/2019       Lab Results   Component Value Date    BILITOT 0.4 08/18/2019    PROT 7.9 08/18/2019    ALBUMIN 4.3 08/18/2019    ALT 31 08/18/2019    AST 44 08/18/2019    ALKPHOS 110 08/18/2019       No results found for: LABPROT, INR, APTT

## 2019-08-18 NOTE — Unmapped (Signed)
Hematology/Oncology Brief ED Communication Note    Jerry Coleman is a 54 y.o. male with a diagnosis of CML and schizophrenia, suicidal ideation, amphetamine abuse, opioid use disorder, likely current homelessness.      Reason for ED eval: He is being sent to the ED today due to presenting with active delusions, incommunicative with staff, and making inappropriate gestures. I am worried that he is having active delusions and is not safe to go home. I also am worried that unless he becomes established with a psychiatric team we will not be able to treat his CML. We spoke with the psych resident on call who recommended eval in the ED.     The primary outpatient oncologist for this patient is Mariel Aloe.   The nurse navigator for this patient is Wynona Meals.

## 2019-08-18 NOTE — Unmapped (Incomplete)
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 Dasatinib was mailed to his friend's home from [facility] on 5/27.     Oncologic History:  Oncology History    No history exists.       Weight and Vitals:  Wt Readings from Last 3 Encounters:   08/18/19 76.3 kg (168 lb 4.8 oz)   06/23/19 78.6 kg (173 lb 3.2 oz)   03/16/09 73.9 kg (163 lb)     Temp Readings from Last 3 Encounters:   08/18/19 37 ??C (98.6 ??F) (Oral)   06/23/19 37 ??C (98.6 ??F) (Oral)   09/26/14 36.6 ??C (97.9 ??F) (Oral)     BP Readings from Last 3 Encounters:   08/18/19 150/108   06/23/19 124/83   09/26/14 103/72     Pulse Readings from Last 3 Encounters:   08/18/19 99   06/23/19 94   09/26/14 84       Pertinent Labs:  Appointment on 08/18/2019   Component Date Value Ref Range Status   ??? WBC 08/18/2019 11.1* 4.5 - 11.0 10*9/L Final   ??? RBC 08/18/2019 4.63  4.50 - 5.90 10*12/L Final   ??? HGB 08/18/2019 14.7  13.5 - 17.5 g/dL Final   ??? HCT 57/84/6962 42.9  41.0 - 53.0 % Final   ??? MCV 08/18/2019 92.5  80.0 - 100.0 fL Final   ??? MCH 08/18/2019 31.8  26.0 - 34.0 pg Final   ??? MCHC 08/18/2019 34.4  31.0 - 37.0 g/dL Final   ??? RDW 95/28/4132 15.3* 12.0 - 15.0 % Final   ??? MPV 08/18/2019 7.8  7.0 - 10.0 fL Final   ??? Platelet 08/18/2019 365  150 - 440 10*9/L Final   ??? Variable HGB Concentration 08/18/2019 Slight* Not Present Final   ??? Neutrophils % 08/18/2019 46.2  % Final   ??? Lymphocytes % 08/18/2019 32.6  % Final   ??? Monocytes % 08/18/2019 12.5  % Final   ??? Eosinophils % 08/18/2019 5.2  % Final   ??? Basophils % 08/18/2019 1.4  % Final   ??? Absolute Neutrophils 08/18/2019 5.2  2.0 - 7.5 10*9/L Final   ??? Absolute Lymphocytes 08/18/2019 3.6  1.5 - 5.0 10*9/L Final   ??? Absolute Monocytes 08/18/2019 1.4* 0.2 - 0.8 10*9/L Final   ??? Absolute Eosinophils 08/18/2019 0.6* 0.0 - 0.4 10*9/L Final   ??? Absolute Basophils 08/18/2019 0.2* 0.0 - 0.1 10*9/L Final   ??? Large Unstained Cells 08/18/2019 2  0 - 4 % Final   ??? Hyperchromasia 08/18/2019 Slight* Not Present Final   ??? Sodium 08/18/2019 139  135 - 145 mmol/L Final   ??? Potassium 08/18/2019 4.0  3.5 - 5.0 mmol/L Final   ??? Chloride 08/18/2019 106  98 - 107 mmol/L Final   ??? Anion Gap 08/18/2019 8  7 - 15 mmol/L Final   ??? CO2 08/18/2019 25.0  22.0 - 30.0 mmol/L Final   ??? BUN 08/18/2019 11  7 - 21 mg/dL Final   ??? Creatinine 08/18/2019 0.78  0.70 - 1.30 mg/dL Final   ??? BUN/Creatinine Ratio 08/18/2019 14   Final   ??? EGFR CKD-EPI Non-African  American,* 08/18/2019 >90  >=60 mL/min/1.48m2 Final   ??? EGFR CKD-EPI African American, Male 08/18/2019 >90  >=60 mL/min/1.66m2 Final   ??? Glucose 08/18/2019 121  70 - 179 mg/dL Final   ??? Calcium 16/12/9602 9.8  8.5 - 10.2 mg/dL Final   ??? Albumin 54/11/8117 4.3  3.5 - 5.0 g/dL Final   ??? Total Protein 08/18/2019 7.9  6.5 - 8.3 g/dL Final   ??? Total Bilirubin 08/18/2019 0.4  0.0 - 1.2 mg/dL Final   ??? AST 14/78/2956 44  19 - 55 U/L Final   ??? ALT 08/18/2019 31  <50 U/L Final   ??? Alkaline Phosphatase 08/18/2019 110  38 - 126 U/L Final       Allergies:   Allergies   Allergen Reactions   ??? Codeine    ??? Acetaminophen Nausea And Vomiting     (Per patient's other chart in Epic- Colletta [place], MRN [id number])     ??? Morphine Nausea And Vomiting     Drug Interactions: none identified ***    Current Medications:  Current Outpatient Medications   Medication Sig Dispense Refill   ??? cholecalciferol, vitamin D3 25 mcg, 1,000 units,, (CHOLECALCIFEROL-25 MCG, 1,000 UNIT,) 1,000 unit (25 mcg) tablet Take 25 mcg by mouth daily. (Patient not taking: Reported on 07/27/2019)     ??? dasatinib (SPRYCEL) 100 mg tablet Take 1 tablet (100 mg total) by mouth daily. 30 tablet 5   ??? diclofenac sodium (VOLTAREN) 1 % gel Apply 2 g topically daily as needed. (Patient not taking: Reported on 07/27/2019)     ??? DULoxetine (CYMBALTA) 60 MG capsule Take 1 capsule (60 mg total) by mouth Two (2) times a day. (Patient not taking: Reported on 07/27/2019) 60 capsule 1   ??? gabapentin (NEURONTIN) 300 MG capsule Take 2 capsules (600 mg total) by mouth Three (3) times a day. (Patient not taking: Reported on 07/27/2019) 90 capsule 1   ??? haloperidol (HALDOL) 2 mg/mL solution Take 2 mg by mouth Two (2) times a day. Prn (Patient not taking: Reported on 07/27/2019)     ??? LORazepam (ATIVAN) 2 MG tablet Take 2 mg by mouth every six (6) hours as needed for anxiety. (Patient not taking: Reported on 07/27/2019)     ??? magnesium hydroxide (PHILLIPS CHEWS) 311 MG Chew Chew 311 mg every four (4) hours as needed. Prn if no BM in 3 days (Patient not taking: Reported on 07/27/2019)     ??? sodium chloride 3 % nebulizer solution Inhale 4 mL by nebulization as needed for other (Epistaxis). (Patient not taking: Reported on 07/27/2019)       No current facility-administered medications for this visit.     *  Adherence:  **      Assessment: JerryColeman is a 54 y.o. 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    I spent *** minutes with JerryColeman in direct patient care.    Ronnald Collum, PharmD  Hematology/Oncology Clinical Pharmacist  Pager [phone]
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
## 2019-08-18 NOTE — Unmapped (Signed)
Labs drawn peripherally by  Waymon Budge Phlebotomist. ;

## 2019-08-19 DIAGNOSIS — F29 Unspecified psychosis not due to a substance or known physiological condition: Secondary | ICD-10-CM | POA: Diagnosis not present

## 2019-08-19 LAB — DRUG SCREEN, URINE
BARBITURATE SCREEN URINE: <200
BENZODIAZEPINE SCREEN, URINE: <200
COCAINE(METAB.)SCREEN, URINE: <150

## 2019-08-19 LAB — BENZODIAZEPINE SCREEN, URINE: Lab: <200

## 2019-08-19 NOTE — Unmapped (Signed)
PSYCHIATRIC EMERGENCY SERVICE                     PROGRESS NOTE    This Clinical research associate attempted to meet with patient twice this afternoon for initial psychiatry consult.  Patient received 5mg  Haldol and 2mg  Ativan at 1:22PM due to agitation.  Attempted to meet with patient at 4:05PM. Patient was sleeping in bed, quietly snoring. Attempted to wake patient up x4 with verbal prompting and tapping patient's foot. Patient not waking up.   This Clinical research associate again attempted to meet with patient for interview several hours later. Patient mumbled in response to this Clinical research associate, however this Clinical research associate was unable to understand patient. When asked if there is someone this Clinical research associate could call, patient shook his head. Patient was unable to stay awake to speak with this Clinical research associate. Will defer assessment at this time.    Davene Costain, LCSW-A
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
## 2019-08-19 NOTE — Unmapped (Signed)
ED Progress Note    August 19, 2019 2:20 PM    Bradan Congrove Buchberger is a 54 y.o. male with a past medical history of CML (first diagnosed 2013), schizophrenia, amphetamine abuse, and opioid use disorder presenting to the ED for psychiatric evaluation.  The patient was seen at [facility] 6/17 for evaluation of CML but was noted to be psychotic referred to ED for evaluation of psychosis. He has been of of dasatinib for several months due to psychosis.    ??  On arrival to the ED, the patient was speaking of the devil, cursing at staff, using profanity, spitting, and yelling. Cooperative following IM Haldol and ativan.    Today is guarded with limited spontaneous speech, only responding to direct questioning. He tells me he feels well but has some nausea and then tells me to go away. Refused physical exam but overall well appearing.    UDS + methamphetamine and Nursing was unable to collect CBC, CMP, and TSH ordered as part of initial medical clearance but chart review shows was collected at clinic. Labs reviewed and unremarkable, repeat today not necessary.    Regarding his CML heme/onc notes indicate he would benefit from restarting TKI but only once psychiatrically stable.     At this time he is medically cleared and has been evaluated by psychiatry who recommends psychiatric hospitalization.

## 2019-08-19 NOTE — Unmapped (Signed)
This patient has been disenrolled from the [facility] specialty pharmacy services due to Medication on hold. Provider unable to properly assess patient and safely prescribe dasatinib at this time.  [facility] can re-enroll patient if medication resumed.Jerry Coleman  [facility] Specialty Pharmacist

## 2019-08-19 NOTE — Unmapped (Signed)
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 documented above.  Rationale for Actions Taken and Not Taken: The patient was scored as Low Risk (08/18/19 1309) on the initial [place]??Suicide??Severity Rating done by the nursing staff. It is my clinical judgment that his observation level should be maintained at q15 min checks at this time. This will be reassessed if there is a clinically significant change in the status of the patient. This judgment is based on our ability to directly address??suicide??risk, implement??suicide??prevention strategies and develop a safety plan while he is in the clinical setting.

## 2019-08-20 DIAGNOSIS — F209 Schizophrenia, unspecified: Secondary | ICD-10-CM | POA: Diagnosis not present

## 2019-08-21 DIAGNOSIS — F602 Antisocial personality disorder: Secondary | ICD-10-CM | POA: Diagnosis not present

## 2019-08-21 DIAGNOSIS — F209 Schizophrenia, unspecified: Secondary | ICD-10-CM | POA: Diagnosis not present

## 2019-08-21 DIAGNOSIS — F1520 Other stimulant dependence, uncomplicated: Secondary | ICD-10-CM | POA: Diagnosis not present

## 2019-08-21 DIAGNOSIS — F29 Unspecified psychosis not due to a substance or known physiological condition: Secondary | ICD-10-CM | POA: Diagnosis not present

## 2019-08-21 DIAGNOSIS — C9210 Chronic myeloid leukemia, BCR/ABL-positive, not having achieved remission: Secondary | ICD-10-CM | POA: Diagnosis not present

## 2019-08-21 DIAGNOSIS — F1510 Other stimulant abuse, uncomplicated: Secondary | ICD-10-CM | POA: Diagnosis not present

## 2019-08-21 DIAGNOSIS — F1220 Cannabis dependence, uncomplicated: Secondary | ICD-10-CM | POA: Diagnosis not present

## 2019-08-21 DIAGNOSIS — B1920 Unspecified viral hepatitis C without hepatic coma: Secondary | ICD-10-CM | POA: Diagnosis not present

## 2019-08-21 DIAGNOSIS — Z20822 Contact with and (suspected) exposure to covid-19: Secondary | ICD-10-CM | POA: Diagnosis not present

## 2019-08-21 DIAGNOSIS — B182 Chronic viral hepatitis C: Secondary | ICD-10-CM | POA: Diagnosis not present

## 2019-08-21 DIAGNOSIS — F1190 Opioid use, unspecified, uncomplicated: Secondary | ICD-10-CM | POA: Diagnosis not present

## 2019-08-21 DIAGNOSIS — F419 Anxiety disorder, unspecified: Secondary | ICD-10-CM | POA: Diagnosis not present

## 2019-08-21 DIAGNOSIS — F1590 Other stimulant use, unspecified, uncomplicated: Secondary | ICD-10-CM | POA: Diagnosis not present

## 2019-08-21 DIAGNOSIS — F1290 Cannabis use, unspecified, uncomplicated: Secondary | ICD-10-CM | POA: Diagnosis not present

## 2019-08-21 DIAGNOSIS — Z590 Homelessness: Secondary | ICD-10-CM | POA: Diagnosis not present

## 2019-08-29 MED ORDER — INVEGA SUSTENNA 156 MG/ML INTRAMUSCULAR SYRINGE
INTRAMUSCULAR | 0 refills | 28 days
Start: 2019-08-29 — End: 2019-08-29

## 2019-09-01 MED ORDER — HYDROXYZINE HCL 25 MG TABLET
ORAL_CAPSULE | Freq: Four times a day (QID) | ORAL | 0 refills | 8 days | Status: CN | PRN
Start: 2019-09-01 — End: ?

## 2019-09-01 MED ORDER — NALOXONE 4 MG/ACTUATION NASAL SPRAY
0 refills | 0 days | Status: CP
Start: 2019-09-01 — End: ?
  Filled 2019-09-01: qty 2, 1d supply, fill #0

## 2019-09-01 MED FILL — NARCAN 4 MG/ACTUATION NASAL SPRAY: 1 days supply | Qty: 2 | Fill #0 | Status: AC

## 2019-09-06 DIAGNOSIS — Z79899 Other long term (current) drug therapy: Secondary | ICD-10-CM | POA: Diagnosis not present

## 2019-09-06 DIAGNOSIS — F29 Unspecified psychosis not due to a substance or known physiological condition: Secondary | ICD-10-CM | POA: Diagnosis not present

## 2019-09-06 DIAGNOSIS — F23 Brief psychotic disorder: Secondary | ICD-10-CM | POA: Diagnosis not present

## 2019-09-06 DIAGNOSIS — R000 Tachycardia, unspecified: Secondary | ICD-10-CM | POA: Diagnosis not present

## 2019-09-06 DIAGNOSIS — R45851 Suicidal ideations: Secondary | ICD-10-CM | POA: Diagnosis not present

## 2019-09-06 DIAGNOSIS — F17210 Nicotine dependence, cigarettes, uncomplicated: Secondary | ICD-10-CM | POA: Diagnosis not present

## 2019-09-13 DIAGNOSIS — F29 Unspecified psychosis not due to a substance or known physiological condition: Secondary | ICD-10-CM | POA: Diagnosis not present

## 2019-09-14 DIAGNOSIS — C9210 Chronic myeloid leukemia, BCR/ABL-positive, not having achieved remission: Principal | ICD-10-CM

## 2019-09-15 ENCOUNTER — Ambulatory Visit: Admit: 2019-09-15 | Payer: MEDICAID | Attending: Hematology | Primary: Hematology

## 2019-09-15 ENCOUNTER — Ambulatory Visit: Admit: 2019-09-15 | Payer: MEDICAID

## 2019-09-15 ENCOUNTER — Ambulatory Visit: Admit: 2019-09-15 | Payer: MEDICAID | Attending: Pharmacist | Primary: Pharmacist

## 2019-09-23 DIAGNOSIS — F29 Unspecified psychosis not due to a substance or known physiological condition: Secondary | ICD-10-CM | POA: Diagnosis not present

## 2019-09-29 MED ORDER — INVEGA SUSTENNA 117 MG/0.75 ML INTRAMUSCULAR SYRINGE
INTRAMUSCULAR | 0 refills | 28.00000 days
Start: 2019-09-29 — End: 2019-10-29

## 2019-10-23 DIAGNOSIS — R456 Violent behavior: Secondary | ICD-10-CM | POA: Diagnosis not present

## 2019-10-23 DIAGNOSIS — G8929 Other chronic pain: Secondary | ICD-10-CM | POA: Diagnosis not present

## 2019-10-23 DIAGNOSIS — Z79899 Other long term (current) drug therapy: Secondary | ICD-10-CM | POA: Diagnosis not present

## 2019-10-23 DIAGNOSIS — E7800 Pure hypercholesterolemia, unspecified: Secondary | ICD-10-CM | POA: Diagnosis not present

## 2019-10-23 DIAGNOSIS — R404 Transient alteration of awareness: Secondary | ICD-10-CM | POA: Diagnosis not present

## 2019-10-23 DIAGNOSIS — C9210 Chronic myeloid leukemia, BCR/ABL-positive, not having achieved remission: Secondary | ICD-10-CM | POA: Diagnosis not present

## 2019-10-23 DIAGNOSIS — T40604A Poisoning by unspecified narcotics, undetermined, initial encounter: Secondary | ICD-10-CM | POA: Diagnosis not present

## 2019-10-23 DIAGNOSIS — R4182 Altered mental status, unspecified: Secondary | ICD-10-CM | POA: Diagnosis not present

## 2019-10-23 DIAGNOSIS — F17210 Nicotine dependence, cigarettes, uncomplicated: Secondary | ICD-10-CM | POA: Diagnosis not present

## 2019-10-23 DIAGNOSIS — K219 Gastro-esophageal reflux disease without esophagitis: Secondary | ICD-10-CM | POA: Diagnosis not present

## 2019-11-06 DIAGNOSIS — R000 Tachycardia, unspecified: Secondary | ICD-10-CM | POA: Diagnosis not present

## 2019-11-06 DIAGNOSIS — S3991XA Unspecified injury of abdomen, initial encounter: Secondary | ICD-10-CM | POA: Diagnosis not present

## 2019-11-06 DIAGNOSIS — M542 Cervicalgia: Secondary | ICD-10-CM | POA: Diagnosis not present

## 2019-11-06 DIAGNOSIS — R52 Pain, unspecified: Secondary | ICD-10-CM | POA: Diagnosis not present

## 2019-11-06 DIAGNOSIS — S199XXA Unspecified injury of neck, initial encounter: Secondary | ICD-10-CM | POA: Diagnosis not present

## 2019-11-06 DIAGNOSIS — S0990XA Unspecified injury of head, initial encounter: Secondary | ICD-10-CM | POA: Diagnosis not present

## 2019-11-06 DIAGNOSIS — S299XXA Unspecified injury of thorax, initial encounter: Secondary | ICD-10-CM | POA: Diagnosis not present

## 2019-11-06 DIAGNOSIS — E1165 Type 2 diabetes mellitus with hyperglycemia: Secondary | ICD-10-CM | POA: Diagnosis not present

## 2019-11-30 DIAGNOSIS — R000 Tachycardia, unspecified: Secondary | ICD-10-CM | POA: Diagnosis not present

## 2019-11-30 DIAGNOSIS — F1520 Other stimulant dependence, uncomplicated: Secondary | ICD-10-CM | POA: Diagnosis not present

## 2019-11-30 DIAGNOSIS — R4182 Altered mental status, unspecified: Secondary | ICD-10-CM | POA: Diagnosis not present

## 2019-11-30 DIAGNOSIS — F1590 Other stimulant use, unspecified, uncomplicated: Secondary | ICD-10-CM | POA: Diagnosis not present

## 2019-11-30 DIAGNOSIS — F23 Brief psychotic disorder: Secondary | ICD-10-CM | POA: Diagnosis not present

## 2019-11-30 DIAGNOSIS — C9210 Chronic myeloid leukemia, BCR/ABL-positive, not having achieved remission: Secondary | ICD-10-CM | POA: Diagnosis not present

## 2019-12-02 DIAGNOSIS — F1590 Other stimulant use, unspecified, uncomplicated: Secondary | ICD-10-CM | POA: Diagnosis not present

## 2019-12-02 DIAGNOSIS — F29 Unspecified psychosis not due to a substance or known physiological condition: Secondary | ICD-10-CM | POA: Diagnosis not present

## 2019-12-02 DIAGNOSIS — R45850 Homicidal ideations: Secondary | ICD-10-CM | POA: Diagnosis not present

## 2019-12-02 DIAGNOSIS — F1220 Cannabis dependence, uncomplicated: Secondary | ICD-10-CM | POA: Diagnosis not present

## 2019-12-02 DIAGNOSIS — F23 Brief psychotic disorder: Secondary | ICD-10-CM | POA: Diagnosis not present

## 2019-12-02 DIAGNOSIS — E785 Hyperlipidemia, unspecified: Secondary | ICD-10-CM | POA: Diagnosis not present

## 2019-12-02 DIAGNOSIS — F1520 Other stimulant dependence, uncomplicated: Secondary | ICD-10-CM | POA: Diagnosis not present

## 2019-12-02 DIAGNOSIS — F209 Schizophrenia, unspecified: Secondary | ICD-10-CM | POA: Diagnosis not present

## 2019-12-02 DIAGNOSIS — C9210 Chronic myeloid leukemia, BCR/ABL-positive, not having achieved remission: Secondary | ICD-10-CM | POA: Diagnosis not present

## 2019-12-02 DIAGNOSIS — K219 Gastro-esophageal reflux disease without esophagitis: Secondary | ICD-10-CM | POA: Diagnosis not present

## 2019-12-02 DIAGNOSIS — F319 Bipolar disorder, unspecified: Secondary | ICD-10-CM | POA: Diagnosis not present

## 2019-12-02 DIAGNOSIS — Z62810 Personal history of physical and sexual abuse in childhood: Secondary | ICD-10-CM | POA: Diagnosis not present

## 2019-12-03 DIAGNOSIS — F319 Bipolar disorder, unspecified: Secondary | ICD-10-CM | POA: Diagnosis not present

## 2019-12-03 DIAGNOSIS — F209 Schizophrenia, unspecified: Secondary | ICD-10-CM | POA: Diagnosis not present

## 2019-12-03 DIAGNOSIS — C9210 Chronic myeloid leukemia, BCR/ABL-positive, not having achieved remission: Secondary | ICD-10-CM | POA: Diagnosis not present

## 2019-12-03 DIAGNOSIS — E785 Hyperlipidemia, unspecified: Secondary | ICD-10-CM | POA: Diagnosis not present

## 2019-12-03 DIAGNOSIS — R45850 Homicidal ideations: Secondary | ICD-10-CM | POA: Diagnosis not present

## 2019-12-03 DIAGNOSIS — F29 Unspecified psychosis not due to a substance or known physiological condition: Secondary | ICD-10-CM | POA: Diagnosis not present

## 2019-12-03 DIAGNOSIS — F1520 Other stimulant dependence, uncomplicated: Secondary | ICD-10-CM | POA: Diagnosis not present

## 2019-12-03 DIAGNOSIS — K219 Gastro-esophageal reflux disease without esophagitis: Secondary | ICD-10-CM | POA: Diagnosis not present

## 2019-12-04 DIAGNOSIS — F29 Unspecified psychosis not due to a substance or known physiological condition: Secondary | ICD-10-CM | POA: Diagnosis not present

## 2020-05-03 DIAGNOSIS — C9210 Chronic myeloid leukemia, BCR/ABL-positive, not having achieved remission: Secondary | ICD-10-CM | POA: Diagnosis not present

## 2020-05-03 DIAGNOSIS — Z79899 Other long term (current) drug therapy: Secondary | ICD-10-CM | POA: Diagnosis not present

## 2020-05-03 DIAGNOSIS — R21 Rash and other nonspecific skin eruption: Secondary | ICD-10-CM | POA: Diagnosis not present

## 2020-08-05 IMAGING — CR CHEST - 2 VIEW
2 series · 2 of 2 positions shown · non-contrast
Comparison: 06/08/2018

CLINICAL DATA: Chest pain, history CML

EXAM:
CHEST - 2 VIEW

[chest lat]
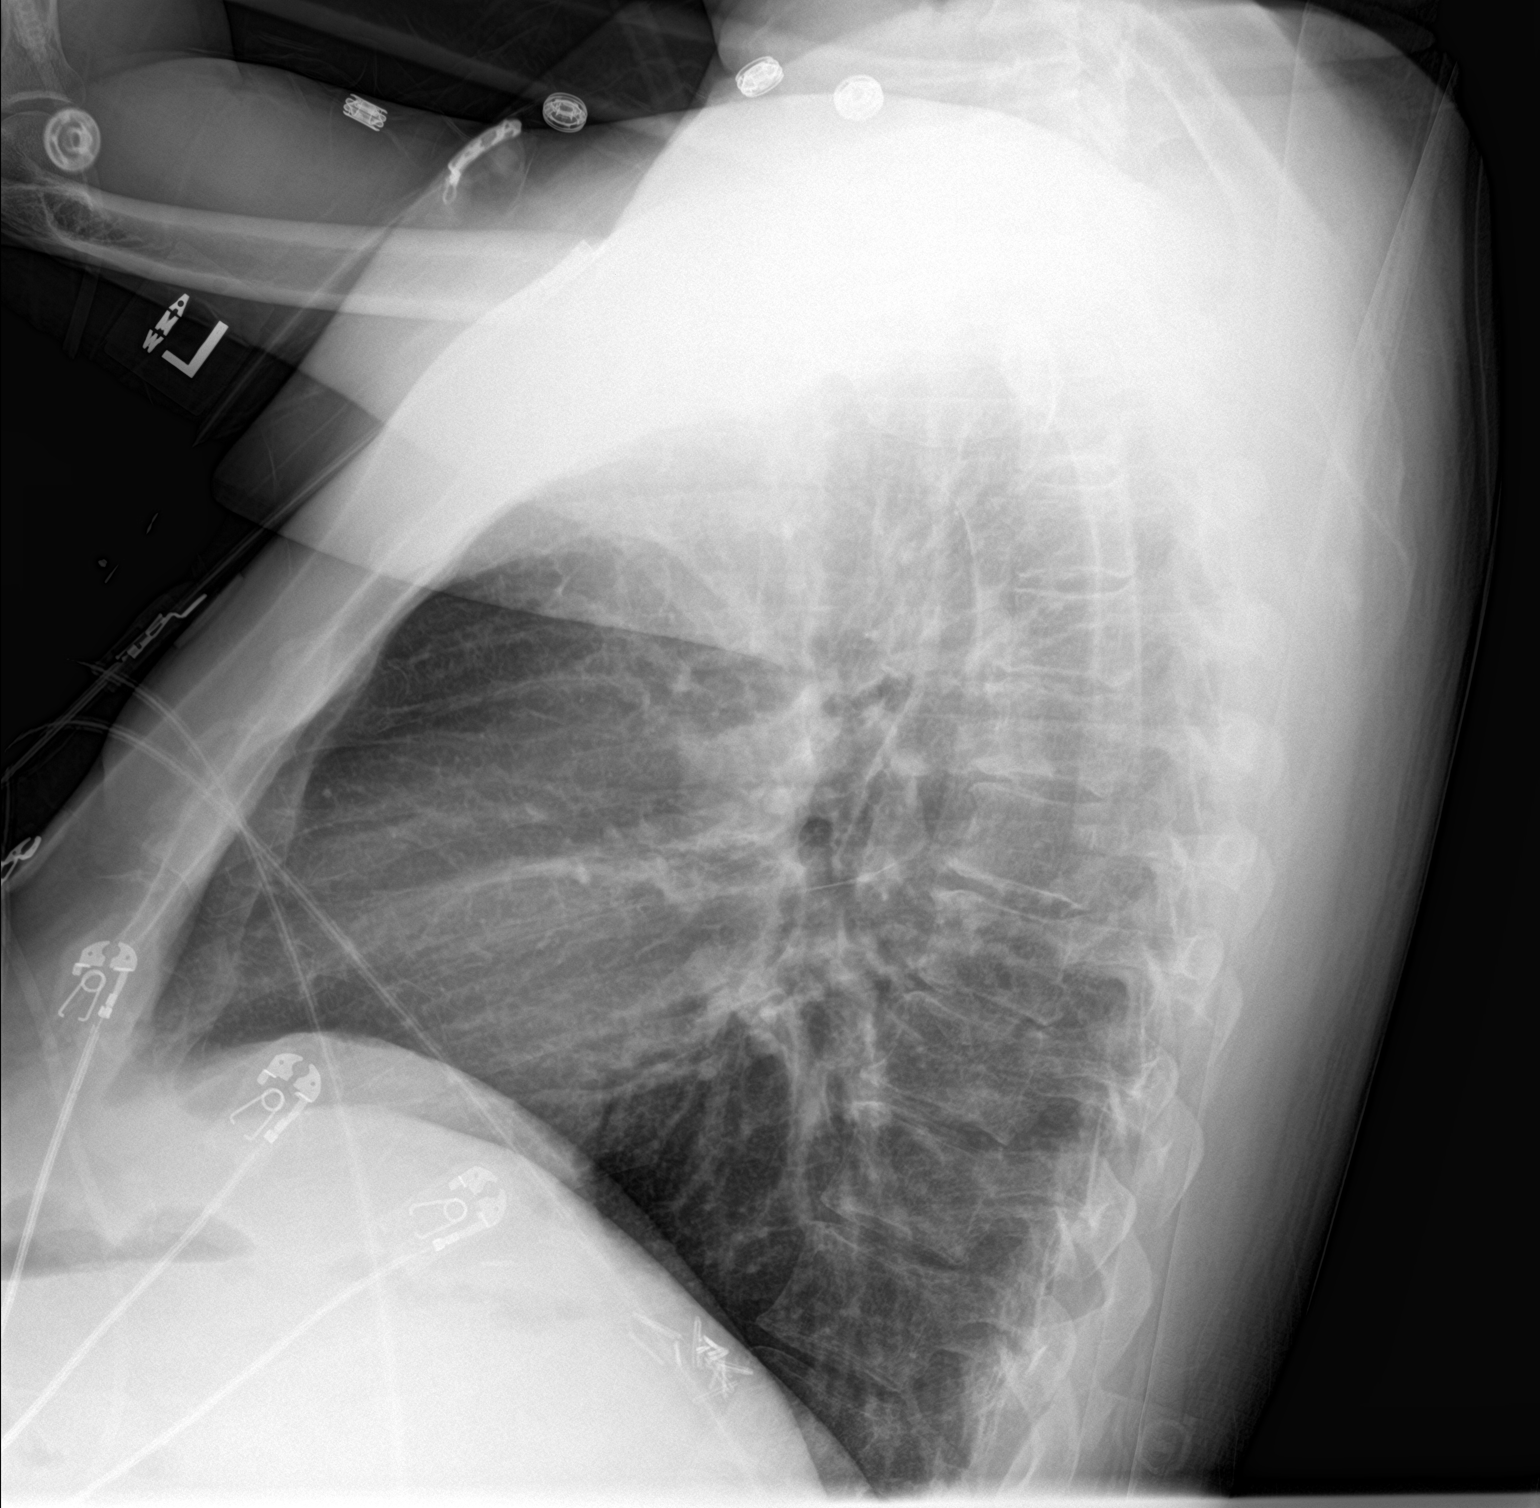

[chest ap]
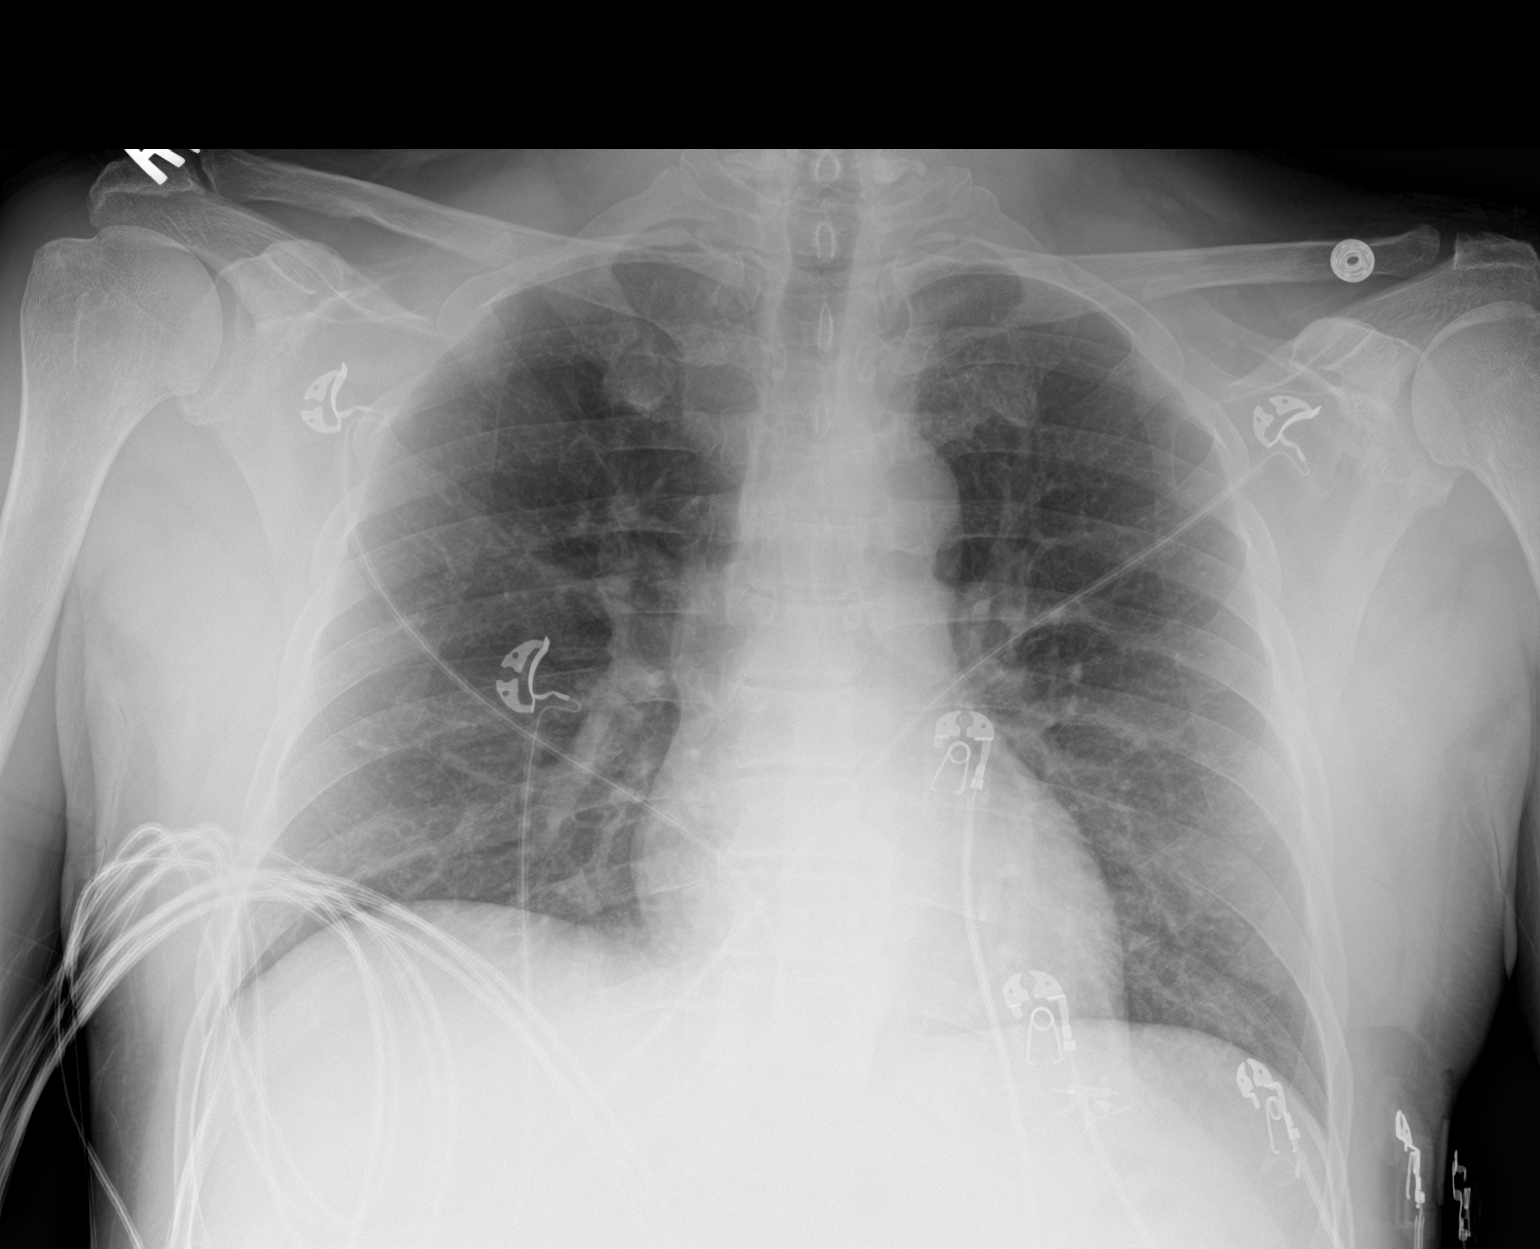

[2 of 2 positions shown; findings below may reference images not displayed]

FINDINGS: Normal heart size, mediastinal contours, and pulmonary vascularity.

Lungs clear.

No infiltrate, pleural effusion or pneumothorax.

Bones unremarkable.
IMPRESSION: Normal exam.

## 2023-03-23 ENCOUNTER — Other Ambulatory Visit (HOSPITAL_COMMUNITY)
Admission: EM | Admit: 2023-03-23 | Discharge: 2023-03-28 | Disposition: A | Discharge status: Home / Self Care | Payer: Medicare Other | Attending: Addiction Medicine | Admitting: Addiction Medicine

## 2023-03-23 DIAGNOSIS — F1111 Opioid abuse, in remission: Secondary | ICD-10-CM | POA: Diagnosis not present

## 2023-03-23 DIAGNOSIS — F1520 Other stimulant dependence, uncomplicated: Secondary | ICD-10-CM | POA: Diagnosis not present

## 2023-03-23 DIAGNOSIS — F17200 Nicotine dependence, unspecified, uncomplicated: Secondary | ICD-10-CM

## 2023-03-23 DIAGNOSIS — Z5900 Homelessness unspecified: Secondary | ICD-10-CM | POA: Insufficient documentation

## 2023-03-23 DIAGNOSIS — C9210 Chronic myeloid leukemia, BCR/ABL-positive, not having achieved remission: Secondary | ICD-10-CM | POA: Diagnosis not present

## 2023-03-23 DIAGNOSIS — F3130 Bipolar disorder, current episode depressed, mild or moderate severity, unspecified: Secondary | ICD-10-CM

## 2023-03-23 DIAGNOSIS — F1190 Opioid use, unspecified, uncomplicated: Secondary | ICD-10-CM | POA: Diagnosis not present

## 2023-03-23 DIAGNOSIS — F319 Bipolar disorder, unspecified: Secondary | ICD-10-CM | POA: Diagnosis not present

## 2023-03-23 DIAGNOSIS — F1910 Other psychoactive substance abuse, uncomplicated: Secondary | ICD-10-CM | POA: Diagnosis present

## 2023-03-23 DIAGNOSIS — F1590 Other stimulant use, unspecified, uncomplicated: Secondary | ICD-10-CM

## 2023-03-23 MED ORDER — ALUM & MAG HYDROXIDE-SIMETH 200-200-20 MG/5ML PO SUSP: 30.0000 mL | ORAL | Status: DC | PRN

## 2023-03-23 MED ORDER — PROPRANOLOL HCL ER 80 MG PO CP24
80.0000 mg | ORAL_CAPSULE | Freq: Every day | ORAL | Status: DC
  Administered 2023-03-24 – 2023-03-27 (×4): 80 mg via ORAL
  Filled 2023-03-23 (×4): qty 1

## 2023-03-23 MED ORDER — LORAZEPAM 2 MG/ML IJ SOLN: 1.0000 mg | Freq: Four times a day (QID) | INTRAMUSCULAR | Status: DC | PRN

## 2023-03-23 MED ORDER — HYDROXYZINE HCL 25 MG PO TABS
25.0000 mg | ORAL_TABLET | Freq: Three times a day (TID) | ORAL | Status: DC | PRN
Start: 2023-03-23 — End: 2023-03-28

## 2023-03-23 MED ORDER — DASATINIB 20 MG PO TABS: 100.0000 mg | ORAL_TABLET | Freq: Every day | ORAL | Status: DC

## 2023-03-23 MED ORDER — LORAZEPAM 1 MG PO TABS
1.0000 mg | ORAL_TABLET | Freq: Four times a day (QID) | ORAL | Status: DC | PRN
  Administered 2023-03-26: 1 mg via ORAL
  Filled 2023-03-23: qty 1

## 2023-03-23 MED ORDER — LORAZEPAM 1 MG PO TABS
1.0000 mg | ORAL_TABLET | Freq: Four times a day (QID) | ORAL | Status: DC | PRN
Start: 2023-03-23 — End: 2023-03-23

## 2023-03-23 MED ORDER — OLANZAPINE 10 MG PO TABS
10.0000 mg | ORAL_TABLET | Freq: Every day | ORAL | Status: DC
  Administered 2023-03-23 – 2023-03-24 (×2): 10 mg via ORAL
  Filled 2023-03-23 (×2): qty 1

## 2023-03-23 MED ORDER — QUETIAPINE FUMARATE 50 MG PO TABS
50.0000 mg | ORAL_TABLET | Freq: Two times a day (BID) | ORAL | Status: DC
  Administered 2023-03-23 – 2023-03-24 (×2): 50 mg via ORAL
  Filled 2023-03-23 (×2): qty 1

## 2023-03-23 MED ORDER — LORAZEPAM 2 MG/ML IJ SOLN: 2.0000 mg | Freq: Four times a day (QID) | INTRAMUSCULAR | Status: DC | PRN

## 2023-03-23 NOTE — Group Note (Signed)
Group Topic: Understanding Self  Group Date: 03/23/2023 Start Time: 2000 End Time: 2100 Facilitators: Darin Engels  Department: [facility]  Number of Participants: 6  Group Focus: co-dependency, coping skills, healthy friendships, personal responsibility, self-awareness, and self-esteem Treatment Modality:  Leisure Development Interventions utilized were group exercise, story telling, and support Purpose: enhance coping skills, express feelings, improve communication skills, increase insight, and regain self-worth  Name: Randall [name] Date of Birth: 1965-06-08  MR: [id number]    Level of Participation: pt did not attend group  Quality of Participation: pt did not attend group  Interactions with others: pt has been sleep since coming on the unit  Mood/Affect: unable to assess  Triggers (if applicable): n/a Cognition: coherent/clear Progress: None Response: pt has been sleep since coming on the unit, will encourage participation for next group.  Plan: patient will be encouraged to attend groups.   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

## 2023-03-23 NOTE — ED Notes (Signed)
Patient in the bedroom calm and sleeping. NAD. Respirations even and unlabored. Will continue to monitor for safety.

## 2023-03-23 NOTE — ED Notes (Addendum)
Pt transferred from [facility] to [facility] requesting detox from meth and endorsing SI no plan and AH (pt reports he hear whispers). Pt reports his last use of meth was 2 days ago. Pt recently released away from prison. Pt has hx of CML (Leukemia). Pt states not being compliant with oral medication given for cancer. Medication secured in locker #27. Pt denies discomfort. Calm, cooperative throughout interview process. Skin assessment completed. Oriented to unit. Meal and drink offered. At currrent, pt denies SI/HI/AVH. Pt verbally contract for safety. Will monitor for safety.

## 2023-03-23 NOTE — ED Notes (Signed)
Patient is in the bedroom calm and sleeping.Respirations are even and unlabored.  Will monitor for safety.

## 2023-03-24 DIAGNOSIS — F1520 Other stimulant dependence, uncomplicated: Secondary | ICD-10-CM | POA: Diagnosis not present

## 2023-03-24 DIAGNOSIS — F1190 Opioid use, unspecified, uncomplicated: Secondary | ICD-10-CM | POA: Diagnosis not present

## 2023-03-24 MED ORDER — NICOTINE 21 MG/24HR TD PT24: 21.0000 mg | MEDICATED_PATCH | Freq: Every day | TRANSDERMAL | Status: DC | PRN

## 2023-03-24 MED ORDER — IBUPROFEN 400 MG PO TABS
400.0000 mg | ORAL_TABLET | Freq: Four times a day (QID) | ORAL | Status: DC | PRN
  Administered 2023-03-24 – 2023-03-27 (×3): 400 mg via ORAL
  Filled 2023-03-24 (×3): qty 1

## 2023-03-24 MED ORDER — QUETIAPINE FUMARATE 100 MG PO TABS: 100.0000 mg | ORAL_TABLET | Freq: Every day | ORAL | Status: DC

## 2023-03-24 MED ORDER — QUETIAPINE FUMARATE 50 MG PO TABS
50.0000 mg | ORAL_TABLET | Freq: Two times a day (BID) | ORAL | Status: AC
  Administered 2023-03-24: 50 mg via ORAL
  Filled 2023-03-24: qty 1

## 2023-03-24 NOTE — ED Notes (Signed)
Patient in the bedroom cal and sleeping. NAD. Respirations even and unlabored.  Will continue to monitor for safety.

## 2023-03-24 NOTE — ED Notes (Signed)
Patient resting with eyes closed in no apparent acute distress. Respirations even and unlabored. Environment secured. Safety checks in place according to facility policy.

## 2023-03-24 NOTE — Group Note (Signed)
Group Topic: Positive Affirmations  Group Date: 03/24/2023 Start Time: 2130 End Time: 2200 Facilitators: Guss Bunde  Department: [facility]  Number of Participants: 5  Group Focus: check in Treatment Modality:  Leisure Development Interventions utilized were leisure development Purpose: reinforce self-care  Name: Randall [name] Date of Birth: [birth year]  MR: [id number]    Level of Participation: Did not attend Quality of Participation:  Interactions with others:  Mood/Affect:  Triggers (if applicable):  Cognition:  Progress:  Response:  Plan:   Patients Problems:  Patient Active Problem List   Diagnosis Date Noted   Polysubstance abuse (HCC) 03/23/2023   Schizoaffective disorder, bipolar type (HCC)    Suicide attempt (HCC) 07/29/2018   MDD (major depressive disorder) 07/29/2018   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

## 2023-03-24 NOTE — Group Note (Signed)
Group Topic: Recovery Basics  Group Date: 03/24/2023 Start Time: 1000 End Time: 1030 Facilitators: Concha Norway, NT  Department: [facility]  Number of Participants: 7  Group Focus: check in Treatment Modality:  Leisure Development Interventions utilized were other  Purpose: enhance coping skills  Name: Randall [name] Date of Birth: Jun 16, 1965  MR: [id number]    Level of Participation: paitient didn't attend Quality of Participation:  Interactions with others:  Mood/Affect:  Triggers (if applicable):  Cognition:  Progress:  Response:  Plan:   Patients Problems:  Patient Active Problem List   Diagnosis Date Noted   Polysubstance abuse (HCC) 03/23/2023   Schizoaffective disorder, bipolar type (HCC)    Suicide attempt (HCC) 07/29/2018   MDD (major depressive disorder) 07/29/2018   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

## 2023-03-24 NOTE — Progress Notes (Signed)
Prior-To-Admission Oral Chemotherapy for Treatment of Oncologic Disease   Order noted from Dr. Theodis Aguas to d/c prior-to-admission oral chemotherapy regimen of Sprycel (dasatinib).  Procedure Per Pharmacy & Therapeutics Committee Policy: Orders for continuation of home oral chemotherapy for treatment of an oncologic disease will be held unless approved by an oncologist during current admission.    For patients receiving oncology care at [facility], inpatient pharmacist contacts patient's oncologist during regular office hours to review. If earlier review is medically necessary, attending physician consults [facility] on-call oncologist   For patients receiving oncology care outside of [facility], attending physician consults patient's oncologist to review. If this oncologist or their coverage cannot be reached, attending physician consults [facility] on-call oncologist   Oral chemotherapy continuation order is on hold pending oncologist review, Non-[facility] oncologist Dr Ottis Stain from [facility] provides oncology care and should be consulted by attending physician. D/c med per Dr. Ottis Stain.      Randall [name] 03/24/2023, 10:18 AM

## 2023-03-24 NOTE — ED Notes (Signed)
Pt was provided breakfast.

## 2023-03-24 NOTE — ED Notes (Signed)
Patient is sleeping. Respirations equal and unlabored, skin warm and dry. No change in assessment or acuity. Routine safety checks conducted according to facility protocol. Will continue to monitor for safety.   

## 2023-03-24 NOTE — Tx Team (Signed)
Per chart, "Pt transferred from [facility] to [facility] requesting detox from meth and endorsing SI no plan and AH (pt reports he hear whispers). Pt reports his last use of meth was 2 days ago. Pt recently released away from prison. Pt has hx of CML (Leukemia). Pt states not being compliant with oral medication given for cancer".    LCSW and MD discussed patient this morning and per MD, patient is requesting residential placement for his substance use. Patient was recently released from prison in December 2024 and has been homeless living on the streets in [place]. Patient reported having encouragement a friend in order to seek further help for himself. Patient reports being on disability, having chronic leukemia, and reports non compliance with his outpatient follow up and medication. Patient has received residential placement in the past from [facility] and [facility] about 7 years ago. Patient is from [facility] and has Medicare Part A and B. LCSW will review facilities and follow up with the patient to explore if he wants to be considered for facilities outside of [place]. LCSW will follow up with updates as received.  Fernande Boyden, LCSW Clinical Social Worker [facility] Ph: [phone]

## 2023-03-24 NOTE — ED Notes (Signed)
Pt was provided dinner.

## 2023-03-24 NOTE — Group Note (Unsigned)
Group Topic: Positive Affirmations  Group Date: 03/24/2023 Start Time: 1000 End Time: 1030 Facilitators: Concha Norway, NT  Department: [facility]  Number of Participants: 7  Group Focus: affirmation Treatment Modality:  Solution-Focused Therapy Interventions utilized were group exercise Purpose: enhance coping skills   Name: Randall [name] Date of Birth: 1965/06/05  MR: [id number]    Level of Participation: {THERAPIES; PSYCH GROUP PARTICIPATION WUJWJ:19147} Quality of Participation: {THERAPIES; PSYCH QUALITY OF PARTICIPATION:23992} Interactions with others: {THERAPIES; PSYCH INTERACTIONS:23993} Mood/Affect: {THERAPIES; PSYCH MOOD/AFFECT:23994} Triggers (if applicable): *** Cognition: {THERAPIES; PSYCH COGNITION:23995} Progress: {THERAPIES; PSYCH PROGRESS:23997} Response: *** Plan: {THERAPIES; PSYCH WGNF:62130}  Patients Problems:  Patient Active Problem List   Diagnosis Date Noted   Polysubstance abuse (HCC) 03/23/2023   Schizoaffective disorder, bipolar type (HCC)    Suicide attempt (HCC) 07/29/2018   MDD (major depressive disorder) 07/29/2018   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

## 2023-03-24 NOTE — ED Notes (Signed)
Pt is in his room resting in bed. Pt denies SI/HI/AVH. No acute distress noted. Will continue to monitor for safety.
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
## 2023-03-24 NOTE — ED Notes (Signed)
Patient alert & oriented x4. Denies intent to harm self or others when asked. Denies A/VH. Patient reports generalized pain 7/10, PRN Ibuprofen administered with no complications. No acute distress noted. Support and encouragement provided. Routine safety checks conducted per facility protocol. Encouraged patient to notify staff if any thoughts of harm towards self or others arise. Patient verbalizes understanding and agreement.

## 2023-03-25 DIAGNOSIS — F3130 Bipolar disorder, current episode depressed, mild or moderate severity, unspecified: Secondary | ICD-10-CM | POA: Diagnosis not present

## 2023-03-25 DIAGNOSIS — F1520 Other stimulant dependence, uncomplicated: Secondary | ICD-10-CM | POA: Diagnosis not present

## 2023-03-25 DIAGNOSIS — F17200 Nicotine dependence, unspecified, uncomplicated: Secondary | ICD-10-CM | POA: Diagnosis not present

## 2023-03-25 DIAGNOSIS — Z5900 Homelessness unspecified: Secondary | ICD-10-CM

## 2023-03-25 DIAGNOSIS — F1190 Opioid use, unspecified, uncomplicated: Secondary | ICD-10-CM

## 2023-03-25 MED ORDER — QUETIAPINE FUMARATE 100 MG PO TABS
150.0000 mg | ORAL_TABLET | Freq: Every day | ORAL | Status: DC
  Administered 2023-03-25 – 2023-03-27 (×3): 150 mg via ORAL
  Filled 2023-03-25 (×3): qty 1

## 2023-03-25 NOTE — Discharge Instructions (Addendum)
Patient will be discharging to [facility] on 03/28/23 with transportation provided via [employer] at 8:05am. [facility] will provide transport to [facility]. Address is [address] [place], [place] [address]. Staff will pick the patient up once he arrives that the station. Number for facility: [phone]   [facility] [address], [place], [address] [phone] phone  New Patient Assessment/Therapy Walk-Ins:  Monday and Wednesday: 8 am until slots are full. Every 1st and 2nd Fridays of the month: 1 pm - 5 pm.  NO ASSESSMENT/THERAPY WALK-INS ON TUESDAYS OR THURSDAYS  New Patient Assessment/Medication Management Walk-Ins:  Monday - Friday:  8 am - 11 am.  For all walk-ins, we ask that you arrive by 7:30 am because patients will be seen in the order of arrival.  Availability is limited; therefore, you may not be seen on the same day that you walk-in.  Our goal is to serve and meet the needs of our community to the best of our [place] ability.  SUBSTANCE USE TREATMENT for Medicaid and State Funded/IPRS  [facility] [address], [place], [address] [phone] phone NOTE: [facility] is no longer offering IOP services.  Serves those who are low-income or have no insurance.  [facility] [address], [place], [place], [address] [phone] phone [phone] fax NOTE: Does have Substance Abuse-Intensive Outpatient Program [facility]) as well as transitional housing if eligible.  [facility] [address] [place], [place], [address] [phone] phone [phone] fax  [facility] [phone] [place], [place], [address] [phone] phone [phone] fax  HALFWAY HOUSES:  [facility] [phone]  [employer].oxfordvacancies.com  12 STEP PROGRAMS:  Alcoholics Anonymous of [place] [employer]  Narcotics Anonymous of [place]  HitProtect.dk  Al-Anon of [place], [place] www.[place]alanon.org/find-meetings.html  Nar-Anon https://nar-anon.org/find-a-meetin  List of Residential placements:   [facility] in [place]: [phone]  [facility]: [phone]  [place], [place] [phone]: Male and male facility; 30-day program: (uninsured and Medicaid such as Laurena Bering, [place], [place], partners)  [facility]: [phone]; men and women's facility; 28 days; Can have Medicaid tailored plan Tour manager or Partners)  [facility]: [phone] Karoline Caldwell or Larita Fife; 28 day program; must be fully detox; tailored Medicaid or no insurance  [address] in [place], [place]; [phone]; 28 day all males program; no insurance accepted  [facility] in [place]: Gabriel Rung [phone] (no insurance or Medicaid only); 90 days; outpatient services but provide housing in apartments [place]  [facility] Admission: [phone]: Patient must complete phone screening for placement: [place], [place]; 6 month program; uninsured, Medicaid, and [employer].   [facility]: no insurance required; [phone]  [facility]: [phone]; Intake: Molly Maduro; Must fill out application online; Alecia Lemming Delay [phone] x [address] [facility] in [place], [place]: [phone]; Admissions Coordinators Mr. Maurine Minister or Barron Alvine; 90 day program.  [facility]: [place], [place] [phone]; Co-Ed 9 month to a year program; Online application; Men entry fee is $500 (6-3months);  [employer]: [address] [place], [place] [address]; no fee or insurance required; minimum of 2 years; Highly structured; work based; Intake Coordinator is Thayer Ohm [phone]  [facility] in [place], [place]: [phone]; Fax number is [phone]; website: www.[facility].org; Requires 3-6 page  autobiography; 2 year program (18 months and then 77month transitional housing); Admission fee is $300; no insurance needed; work Automotive engineer in [place], [place]:  Front Desk Staff: Danise Edge [phone]: They have a Men's Regenerations Program 6-72months. Free program; There is an initial $300 fee however, they are willing to work with patients regarding that. Application is online.  [facility]: Admissions [phone] Doran Heater ext [phone]; Any 7-90 day program is out of pocket; 12 month program is free of charge; there is a $275 entry fee; Patient is responsible for own transportation
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
## 2023-03-25 NOTE — Progress Notes (Signed)
Pt is awake, alert and oriented X4. Pt complained of back pain but declined intervention when offered. No signs of acute distress noted. Administered scheduled med per order. Pt denies current SI/HI/AVH, plan or intent. Staff will monitor for pt's safety.

## 2023-03-25 NOTE — Group Note (Signed)
Group Topic: Understanding Self  Group Date: 03/25/2023 Start Time: 0320 End Time: 0400 Facilitators: Loleta Dicker, LCSW  Department: [facility]  Number of Participants: 4  Group Focus: check in, clarity of thought, feeling awareness/expression, personal responsibility, problem solving, relapse prevention, and self-awareness Treatment Modality:  Cognitive Behavioral Therapy, Solution-Focused Therapy, and Spiritual Interventions utilized were clarification, exploration, mental fitness, story telling, and support Purpose: enhance coping skills, explore maladaptive thinking, express feelings, express irrational fears, improve communication skills, increase insight, regain self-worth, reinforce self-care, and relapse prevention strategies  Name: Randall [name] Date of Birth: 12/06/1965  MR: [id number]    Level of Participation: active Quality of Participation: attentive and cooperative Interactions with others: gave feedback Mood/Affect: appropriate Triggers (if applicable): N/A Cognition: coherent/clear Progress: Gaining insight Description of Group:   This group will address the importance of considering the journey and not just the destination. Patients will be encouraged to process areas in their lives where they found it hard to focus on the good rather than the bad, and identify reasons for maintaining such thought pattern. Facilitator will guide patients utilizing problem- solving interventions to address and improve the way each patient views themselves and their situation. Patients will work through understanding and applying humility when it comes to life changes and the interactions we have with others. Patients will be encouraged to explore ways that they will move forward throughout life's journey, and make healthier decisions for themselves.  Summary of Patient Progress: Patient actively participated in group on today. Patient reports feeling  encouraged by the video stating he will be mindful about the process of gold and how it is tried in the fire. Patient reports our lives are just the same. Patient reports he knows he will continue to face trials and tribulations, but how we handle those test will determine what and who we are. Patient reports he also knows that he needs to get what he can along his journey and to not just focus on the destination and where he wants to be in life. Patient reports feeling inspired and reports the video increases his faith and perspective on life. Patient expressed appreciation for LCSW showing the video to change his way of thinking.  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

## 2023-03-25 NOTE — Group Note (Signed)
Group Topic: Communication  Group Date: 03/25/2023 Start Time: 1207 End Time: 1222 Facilitators: Vonzell Schlatter B  Department: [facility]  Number of Participants: 4  Group Focus: communication and daily focus Treatment Modality:  Psychoeducation Interventions utilized were patient education and problem solving Purpose: express feelings and regain self-worth  Name: Srijan Brungardt Date of Birth: [birth year]  MR: [id number]    Level of Participation: active Quality of Participation: attentive and cooperative Interactions with others: gave feedback Mood/Affect: positive Triggers (if applicable): N/A Cognition: coherent/clear Progress: Moderate Response: Pt is wanting to get into a 30 to 60 day recovery center Plan: follow-up needed  Patients Problems:  Patient Active Problem List   Diagnosis Date Noted   Polysubstance abuse (HCC) 03/23/2023   Schizoaffective disorder, bipolar type (HCC)    Suicide attempt (HCC) 07/29/2018   MDD (major depressive disorder) 07/29/2018   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

## 2023-03-25 NOTE — ED Notes (Signed)
Patient is sleeping. Respirations equal and unlabored, skin warm and dry. No change in assessment or acuity. Routine safety checks conducted according to facility protocol. Will continue to monitor for safety.   

## 2023-03-25 NOTE — ED Notes (Signed)
Patient observed/assessed at bedside in patient room. Patient alert and oriented x 4. Affect is flat. Patient denies pain and anxiety. He denies A/V/H. He denies having any thoughts/plan of self harm and harm towards others. Fluid and snack offered. Patient states that appetite has been good throughout the day. Verbalizes no further complaints at this time. Will continue to monitor and support.

## 2023-03-25 NOTE — Group Note (Signed)
Group Topic: Relapse and Recovery  Group Date: 03/25/2023 Start Time: 1410 End Time: 1515 Facilitators: Dickie La, RN  Department: [facility]  Number of Participants: 4  Group Focus: substance abuse education Treatment Modality:  Psychoeducation Interventions utilized were patient education, problem solving, story telling, and support Purpose: enhance coping skills, express feelings, increase insight, and relapse prevention strategies  Name: Randall [name] Date of Birth: April 07, 1965  MR: [id number]    Level of Participation: minimal Quality of Participation: attentive, cooperative, engaged, and offered feedback Interactions with others: gave feedback Mood/Affect: appropriate and positive Triggers (if applicable): none Cognition: coherent/clear, goal directed, insightful, and logical Progress: Gaining insight Response: Pt participated in AA group and gave positive feedback. 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

## 2023-03-25 NOTE — Group Note (Signed)
Group Topic: Recovery Basics  Group Date: 03/25/2023 Start Time: 2000 End Time: 2100 Facilitators: Caroline More, LPN  Department: [facility]  Number of Participants: 5  Group Focus: abuse issues Treatment Modality:  Psychoeducation Interventions utilized were story telling Purpose: enhance coping skills, explore maladaptive thinking, express feelings, increase insight, regain self-worth, reinforce self-care, relapse prevention strategies, and trigger / craving management  Name: Randall [name] Date of Birth: 1965/09/19  MR: [id number]    Level of Participation: minimal Quality of Participation: attentive Interactions with others: gave feedback Mood/Affect: appropriate Triggers (if applicable): n/a Cognition: coherent/clear Progress: Moderate Response: appropriate to group setting  Plan: follow-up needed  Patients Problems:  Patient Active Problem List   Diagnosis Date Noted   Polysubstance abuse (HCC) 03/23/2023   Schizoaffective disorder, bipolar type (HCC)    Suicide attempt (HCC) 07/29/2018   MDD (major depressive disorder) 07/29/2018   

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
## 2023-03-25 NOTE — Progress Notes (Signed)
Pt stayed in his room most of the shift however participated in groups. No distress noted or concerns voiced. Staff will monitor for pt's safety.

## 2023-03-25 NOTE — Discharge Planning (Signed)
LCSW received phone call back from [facility] regarding patient referral. Agency is requesting lab results and information on the last time the patient has been seen by his Oncologist. Information provided to the MD. Documents to be faxed to the agency once available.   Fernande Boyden, LCSW Clinical Social Worker [facility] Ph: [phone]

## 2023-03-26 ENCOUNTER — Encounter (HOSPITAL_COMMUNITY): Payer: Self-pay | Admitting: Nurse Practitioner

## 2023-03-26 DIAGNOSIS — F1520 Other stimulant dependence, uncomplicated: Secondary | ICD-10-CM | POA: Diagnosis not present

## 2023-03-26 DIAGNOSIS — F1190 Opioid use, unspecified, uncomplicated: Secondary | ICD-10-CM | POA: Diagnosis not present

## 2023-03-26 MED ORDER — TRAZODONE HCL 50 MG PO TABS: 50.0000 mg | ORAL_TABLET | Freq: Every evening | ORAL | Status: DC | PRN

## 2023-03-26 MED ORDER — BOOST / RESOURCE BREEZE PO LIQD CUSTOM
1.0000 | Freq: Three times a day (TID) | ORAL | Status: DC
  Administered 2023-03-26: 1 via ORAL
  Administered 2023-03-26: 237 mL via ORAL
  Administered 2023-03-26 – 2023-03-27 (×3): 1 via ORAL

## 2023-03-26 NOTE — ED Notes (Signed)
Patient was provided breakfast

## 2023-03-26 NOTE — Group Note (Signed)
Group Topic: Social Support  Group Date: 03/26/2023 Start Time: 1145 End Time: 1200 Facilitators: Cassandria Anger  Department: [facility]  Number of Participants: 4  Group Focus: healthy friendships Treatment Modality:  Psychoeducation Interventions utilized were patient education Purpose: increase insight  Name: Randall [name] Date of Birth: Apr 06, 1965  MR: [id number]    Level of Participation: moderate Quality of Participation: attentive, cooperative, engaged, and offered feedback Interactions with others: gave feedback Mood/Affect: appropriate, bright, and positive Triggers (if applicable): N/A Cognition: coherent/clear, concrete, and insightful Progress: Moderate Response: Patient described many  different thing that he is thankful for. Patient was enthusiastic throughout group and explained that his top 3 things he is thankful for are his son, [name], his family, and his mind. 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

## 2023-03-26 NOTE — Group Note (Signed)
Group Topic: Health and Wellness  Group Date: 03/26/2023 Start Time: 0250 End Time: 0410 Facilitators: Loleta Dicker, LCSW  Department: [facility]  Number of Participants: 4  Group Focus: clarity of thought, daily focus, and self-awareness Treatment Modality:  Cognitive Behavioral Therapy Interventions utilized were story telling and support Purpose: improve communication skills, increase insight, and regain self-worth  Name: Randall [name] Date of Birth: [birth year]  MR: [id number]    Level of Participation: active Quality of Participation: attentive and cooperative Interactions with others: gave feedback Mood/Affect: appropriate Triggers (if applicable): inability to get past his own thoughts  Cognition: coherent/clear and goal directed Progress: Gaining insight Response: Patient participated in group on today. Patient expressed understanding of group rules and confidentiality. Patient was able to mention his first thought regarding certain words and stigmas attached to mental health and substance use. Patient reports this activity challenged him to think differently about his current circumstance and to reconsider options for coping with said stigmas. No issues to report.  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

## 2023-03-26 NOTE — ED Notes (Signed)
PRN Advil administered due to patient reported pain 8/10 in back. Medication administered with no complications. Environment secured, safety checks in place per facility policy.

## 2023-03-26 NOTE — ED Notes (Signed)
Patient resting with eyes closed in no apparent acute distress. Respirations even and unlabored. Environment secured. Safety checks in place according to facility policy.

## 2023-03-26 NOTE — ED Notes (Signed)
Patient observed/assessed in room in bed appearing in no immediate distress resting peacefully. Q15 minute checks continued by MHT and nursing staff. Will continue to monitor and support. 

## 2023-03-26 NOTE — ED Notes (Signed)
Pt A&O x 4, no acute distress noted. Cooperative, interactive with staff, pleasant with slight anxiety.  Monitoring for safety.

## 2023-03-26 NOTE — Group Note (Signed)
Group Topic: Wellness  Group Date: 03/26/2023 Start Time: 1150 End Time: 1220 Facilitators: Jehan Bonano, Jacklynn Barnacle, RN  Department: [facility]  Number of Participants: 4  Group Focus: nutrition education Treatment Modality:  Interpersonal Therapy Interventions utilized were exploration Purpose: reinforce self-care  Name: Kyheem Boulos Date of Birth: [birth year]  MR: [id number]    Level of Participation: when cued Quality of Participation: attentive and cooperative Interactions with others: gave feedback Mood/Affect: appropriate Triggers (if applicable): none identified Cognition: coherent/clear Progress: Gaining insight Response: Patient participated in group, engaged appropriately Plan: patient will be encouraged to continue to attend groups, participate in care planning, and reach out to staff as needed  Patients Problems:  Patient Active Problem List   Diagnosis Date Noted   Polysubstance abuse (HCC) 03/23/2023   Schizoaffective disorder, bipolar type (HCC)    Suicide attempt (HCC) 07/29/2018   MDD (major depressive disorder) 07/29/2018   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

## 2023-03-26 NOTE — Group Note (Signed)
Group Topic: Social Support  Group Date: 03/26/2023 Start Time: 1900 End Time: 1930 Facilitators: Rae Lips B  Department: [facility]  Number of Participants: 2  Group Focus: acceptance, anxiety, check in, clarity of thought, depression, family, and feeling awareness/expression Treatment Modality:  Individual Therapy Interventions utilized were leisure development Purpose: express feelings  Name: Randall [name] Date of Birth: August 17, 1965  MR: [id number]    Level of Participation: active Quality of Participation: attentive and cooperative Interactions with others: gave feedback Mood/Affect: appropriate Triggers (if applicable): NA Cognition: coherent/clear Progress: Gaining insight Response: NA Plan: patient will be encouraged to go to groups.   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

## 2023-03-26 NOTE — ED Notes (Signed)
Patient alert & oriented x4. Denies intent to harm self or others when asked. Denies A/VH. Patient reported pain 8/10 in back, chronic. PRN Advil administered with no complications per MD order. No acute distress noted. Support and encouragement provided. Routine safety checks conducted per facility protocol. Encouraged patient to notify staff if any thoughts of harm towards self or others arise. Patient verbalizes understanding and agreement.

## 2023-03-26 NOTE — ED Notes (Signed)
Patient sitting in dayroom interacting with peers - in LCSW led group. No acute distress noted. No concerns voiced. Informed patient to notify staff with any needs or assistance. Patient verbalized understanding or agreement. Safety checks in place per facility policy.

## 2023-03-26 NOTE — ED Notes (Signed)
Patient was provided lunch.

## 2023-03-26 NOTE — ED Notes (Signed)
 Patient was provided dinner
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
## 2023-03-26 NOTE — ED Notes (Signed)
Pt resting at present, no distress noted.  Monitoring for safety. 

## 2023-03-27 DIAGNOSIS — F1520 Other stimulant dependence, uncomplicated: Secondary | ICD-10-CM | POA: Diagnosis not present

## 2023-03-27 DIAGNOSIS — F1190 Opioid use, unspecified, uncomplicated: Secondary | ICD-10-CM | POA: Diagnosis not present

## 2023-03-27 MED ORDER — PROPRANOLOL HCL ER 60 MG PO CP24
60.0000 mg | ORAL_CAPSULE | Freq: Every day | ORAL | Status: DC
Start: 2023-03-28 — End: 2023-03-27

## 2023-03-27 MED ORDER — MIRTAZAPINE 15 MG PO TABS: 15.0000 mg | ORAL_TABLET | Freq: Every day | ORAL | 0 refills | Status: AC

## 2023-03-27 MED ORDER — NICOTINE 21 MG/24HR TD PT24: 21.0000 mg | MEDICATED_PATCH | Freq: Every day | TRANSDERMAL | 0 refills | Status: AC | PRN

## 2023-03-27 MED ORDER — QUETIAPINE FUMARATE 150 MG PO TABS: 150.0000 mg | ORAL_TABLET | Freq: Every day | ORAL | 0 refills | Status: AC

## 2023-03-27 MED ORDER — MIRTAZAPINE 15 MG PO TABS
15.0000 mg | ORAL_TABLET | Freq: Every day | ORAL | Status: DC
  Administered 2023-03-27: 15 mg via ORAL
  Filled 2023-03-27: qty 1

## 2023-03-27 MED ORDER — TRAZODONE HCL 50 MG PO TABS: 50.0000 mg | ORAL_TABLET | Freq: Every evening | ORAL | 0 refills | Status: DC | PRN

## 2023-03-27 MED ORDER — PROPRANOLOL HCL ER 80 MG PO CP24: 80.0000 mg | ORAL_CAPSULE | Freq: Every day | ORAL | 0 refills | Status: AC

## 2023-03-27 MED ORDER — PROPRANOLOL HCL ER 80 MG PO CP24: 80.0000 mg | ORAL_CAPSULE | Freq: Every day | ORAL | Status: DC

## 2023-03-27 NOTE — ED Notes (Addendum)
Patient is in the Dayroom calm and watching TV, NAD. Respirations even and unlabored. Will continue to monitor for safety.

## 2023-03-27 NOTE — ED Notes (Signed)
Pt refused vitals RN noted.

## 2023-03-27 NOTE — Group Note (Signed)
Group Topic: Change and Accountability  Group Date: 03/27/2023 Start Time: 1930 End Time: 2130 Facilitators: Rae Lips B  Department: [facility]  Number of Participants: 2  Group Focus: abuse issues, acceptance, activities of daily living skills, affirmation, chemical dependency issues, clarity of thought, communication, coping skills, daily focus, family, feeling awareness/expression, forgiveness, goals/reality orientation, and healthy friendships Treatment Modality:  Engineer, manufacturing systems Therapy, Leisure Counsellor, Patient-Centered Therapy, Solution-Focused Therapy, and Spiritual Interventions utilized were patient education, problem solving, reality testing, story telling, and support Purpose: enhance coping skills, explore maladaptive thinking, express feelings, express irrational fears, increase insight, and reinforce self-care  Name: Randall [name] Date of Birth: 1965/09/03  MR: [id number]    Level of Participation: active Quality of Participation: attentive, cooperative, initiates communication, motivated, offered feedback, and supportive Interactions with others: gave feedback Mood/Affect: appropriate, brightens with interaction, and positive Triggers (if applicable): The bad things that has happened in his life he wants to keep them in the past but he know he's got to talk about it to get it out.  Cognition: coherent/clear, goal directed, insightful, logical, and religious preoccupation Progress: Gaining insight Response: He has a really good outlook on life and what he needs to do to stay on the right track.  Plan: patient will be encouraged to keep going to groups.   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

## 2023-03-27 NOTE — ED Notes (Signed)
Patient has been quiet on unit.  He ate lunch and attended group and returned to room and bed.  No distress or somatic complaint.  Denies avh shi or plan.  Will monitor.

## 2023-03-27 NOTE — ED Notes (Signed)
Patient is asleep in bed without issue or complaint.  No distress.  Will monitor.
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
## 2023-03-27 NOTE — ED Notes (Signed)
Pt sleeping at present, no distress noted, respirations even & unlabored.  Monitoring for safety. ?
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
## 2023-03-27 NOTE — Group Note (Signed)
Group Topic: Recovery Basics  Group Date: 03/27/2023 Start Time: 1200 End Time: 1225 Facilitators: Jenean Lindau, RN  Department: [facility]  Number of Participants: 3  Group Focus: chemical dependency education Treatment Modality:  Behavior Modification Therapy Interventions utilized were patient education Purpose: enhance coping skills, explore maladaptive thinking, express feelings, and increase insight  Name: Randall [name] Date of Birth: Apr 11, 1965  MR: [id number]    Level of Participation: when cued Quality of Participation: attentive Interactions with others: gave feedback Mood/Affect: appropriate Triggers (if applicable):   Cognition: coherent/clear Progress: Minimal Response:   Plan: follow-up needed  Patients Problems:  Patient Active Problem List   Diagnosis Date Noted   Polysubstance abuse (HCC) 03/23/2023   Schizoaffective disorder, bipolar type (HCC)    Suicide attempt (HCC) 07/29/2018   MDD (major depressive disorder) 07/29/2018   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

## 2023-03-27 NOTE — Discharge Planning (Signed)
LCSW followed up with [facility] Admissions Coordinator Jake who reports patient would be a good candidate for their treatment facility. Per Leta Jungling, transportation can be arranged via  [facility] for a [employer] for the patient to arrive on tomorrow 03/28/2023. Per Leta Jungling, bus leaves  out of [place] at [employer] and will arrive in [place] at 2:55pm. Bus ticket to be sent to [employer]. LCSW spoke with patient to confirm if he is in agreement with plan. Patient is agreeable to plan and expressed appreciation for LCSW assistance. No other needs were reported by patient or facility. 30 day script needed and medications can be filled at their facility.    Fernande Boyden, LCSW Clinical Social Worker [facility] Ph: [phone]

## 2023-03-27 NOTE — Group Note (Signed)
Group Topic: Social Support  Group Date: 03/27/2023 Start Time: 1000 End Time: 1041 Facilitators: Vonzell Schlatter B  Department: [facility]  Number of Participants: 4  Group Focus: social skills Treatment Modality:  Psychoeducation Interventions utilized were problem solving and support Purpose: reinforce self-care and relapse prevention strategies  Name: Randall [name] Date of Birth: June 21, 1965  MR: [id number]    Level of Participation: active Quality of Participation: attentive and cooperative Interactions with others: gave feedback Mood/Affect: positive Triggers (if applicable): N/A Cognition: coherent/clear Progress: Moderate Response: Pt is ready to work the [employer] Plan: follow-up needed  Patients Problems:  Patient Active Problem List   Diagnosis Date Noted   Polysubstance abuse (HCC) 03/23/2023   Schizoaffective disorder, bipolar type (HCC)    Suicide attempt (HCC) 07/29/2018   MDD (major depressive disorder) 07/29/2018   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

## 2023-03-28 DIAGNOSIS — F1190 Opioid use, unspecified, uncomplicated: Secondary | ICD-10-CM | POA: Diagnosis not present

## 2023-03-28 NOTE — ED Notes (Signed)
Patient is in the bedroom calm and sleeping.NAD. Respirations even and unlabored. Will continue to monitor for safety.

## 2023-05-18 ENCOUNTER — Ambulatory Visit: Admitting: Student

## 2023-05-22 ENCOUNTER — Inpatient Hospital Stay

## 2023-05-22 ENCOUNTER — Inpatient Hospital Stay: Attending: Oncology | Admitting: Oncology

## 2023-10-07 DIAGNOSIS — C9210 Chronic myeloid leukemia, BCR/ABL-positive, not having achieved remission: Principal | ICD-10-CM

## 2023-10-27 DIAGNOSIS — C9510 Chronic leukemia of unspecified cell type not having achieved remission: Principal | ICD-10-CM

## 2023-10-27 DIAGNOSIS — C9210 Chronic myeloid leukemia, BCR/ABL-positive, not having achieved remission: Principal | ICD-10-CM

## 2023-10-28 ENCOUNTER — Other Ambulatory Visit: Admit: 2023-10-28 | Discharge: 2023-10-29 | Payer: PRIVATE HEALTH INSURANCE

## 2023-10-28 ENCOUNTER — Ambulatory Visit
Admit: 2023-10-28 | Discharge: 2023-10-29 | Payer: PRIVATE HEALTH INSURANCE | Attending: Hematology | Primary: Hematology

## 2023-10-28 DIAGNOSIS — C9510 Chronic leukemia of unspecified cell type not having achieved remission: Principal | ICD-10-CM

## 2023-10-28 DIAGNOSIS — C9210 Chronic myeloid leukemia, BCR/ABL-positive, not having achieved remission: Principal | ICD-10-CM
